# Patient Record
Sex: Female | Born: 1935 | Race: Black or African American | Hispanic: No | Smoking: Never smoker
Health system: Southern US, Community
[De-identification: ages and names within clinical notes are randomized; demographics above are authoritative.]

## PROBLEM LIST (undated history)

## (undated) DIAGNOSIS — J189 Pneumonia, unspecified organism: Secondary | ICD-10-CM

## (undated) DIAGNOSIS — I10 Essential (primary) hypertension: Secondary | ICD-10-CM

## (undated) DIAGNOSIS — E785 Hyperlipidemia, unspecified: Secondary | ICD-10-CM

## (undated) DIAGNOSIS — G3183 Dementia with Lewy bodies: Secondary | ICD-10-CM

## (undated) DIAGNOSIS — R269 Unspecified abnormalities of gait and mobility: Secondary | ICD-10-CM

## (undated) DIAGNOSIS — H409 Unspecified glaucoma: Secondary | ICD-10-CM

## (undated) DIAGNOSIS — I2510 Atherosclerotic heart disease of native coronary artery without angina pectoris: Secondary | ICD-10-CM

## (undated) DIAGNOSIS — M1990 Unspecified osteoarthritis, unspecified site: Secondary | ICD-10-CM

## (undated) DIAGNOSIS — I5030 Unspecified diastolic (congestive) heart failure: Principal | ICD-10-CM

## (undated) DIAGNOSIS — F0390 Unspecified dementia without behavioral disturbance: Secondary | ICD-10-CM

## (undated) DIAGNOSIS — I2721 Secondary pulmonary arterial hypertension: Secondary | ICD-10-CM

## (undated) DIAGNOSIS — E119 Type 2 diabetes mellitus without complications: Secondary | ICD-10-CM

## (undated) DIAGNOSIS — F0280 Dementia in other diseases classified elsewhere without behavioral disturbance: Secondary | ICD-10-CM

## (undated) HISTORY — PX: CATARACT EXTRACTION, BILATERAL: SHX1313

## (undated) HISTORY — PX: CORONARY ARTERY BYPASS GRAFT: SHX141

## (undated) HISTORY — PX: TOTAL KNEE ARTHROPLASTY: SHX125

## (undated) HISTORY — DX: Type 2 diabetes mellitus without complications: E11.9

## (undated) HISTORY — PX: ABDOMINAL HYSTERECTOMY: SHX81

## (undated) HISTORY — DX: Atherosclerotic heart disease of native coronary artery without angina pectoris: I25.10

## (undated) HISTORY — DX: Secondary pulmonary arterial hypertension: I27.21

## (undated) HISTORY — DX: Unspecified diastolic (congestive) heart failure: I50.30

## (undated) HISTORY — DX: Unspecified abnormalities of gait and mobility: R26.9

## (undated) HISTORY — DX: Pneumonia, unspecified organism: J18.9

## (undated) HISTORY — DX: Unspecified glaucoma: H40.9

## (undated) HISTORY — DX: Unspecified osteoarthritis, unspecified site: M19.90

## (undated) HISTORY — DX: Dementia with Lewy bodies: G31.83

## (undated) HISTORY — DX: Dementia in other diseases classified elsewhere without behavioral disturbance: F02.80

---

## 1998-03-30 ENCOUNTER — Emergency Department (HOSPITAL_COMMUNITY): Admission: EM | Admit: 1998-03-30 | Discharge: 1998-03-30 | Payer: Self-pay | Admitting: Emergency Medicine

## 1998-05-19 ENCOUNTER — Other Ambulatory Visit: Admission: RE | Admit: 1998-05-19 | Discharge: 1998-05-19 | Payer: Self-pay | Admitting: Internal Medicine

## 1999-06-26 ENCOUNTER — Emergency Department (HOSPITAL_COMMUNITY): Admission: EM | Admit: 1999-06-26 | Discharge: 1999-06-26 | Payer: Self-pay | Admitting: Internal Medicine

## 1999-07-07 ENCOUNTER — Emergency Department (HOSPITAL_COMMUNITY): Admission: EM | Admit: 1999-07-07 | Discharge: 1999-07-07 | Payer: Self-pay | Admitting: Emergency Medicine

## 2001-03-13 ENCOUNTER — Ambulatory Visit (HOSPITAL_COMMUNITY): Admission: RE | Admit: 2001-03-13 | Discharge: 2001-03-13 | Payer: Self-pay | Admitting: Internal Medicine

## 2001-03-13 ENCOUNTER — Encounter: Payer: Self-pay | Admitting: Internal Medicine

## 2002-12-10 ENCOUNTER — Encounter: Payer: Self-pay | Admitting: Orthopedic Surgery

## 2002-12-10 ENCOUNTER — Ambulatory Visit (HOSPITAL_COMMUNITY): Admission: RE | Admit: 2002-12-10 | Discharge: 2002-12-10 | Payer: Self-pay | Admitting: Orthopedic Surgery

## 2003-02-13 ENCOUNTER — Encounter: Payer: Self-pay | Admitting: Emergency Medicine

## 2003-02-13 ENCOUNTER — Emergency Department (HOSPITAL_COMMUNITY): Admission: EM | Admit: 2003-02-13 | Discharge: 2003-02-13 | Payer: Self-pay | Admitting: Emergency Medicine

## 2005-05-18 ENCOUNTER — Ambulatory Visit: Payer: Self-pay | Admitting: Cardiology

## 2005-05-27 ENCOUNTER — Emergency Department (HOSPITAL_COMMUNITY): Admission: EM | Admit: 2005-05-27 | Discharge: 2005-05-28 | Payer: Self-pay | Admitting: Emergency Medicine

## 2005-06-05 ENCOUNTER — Ambulatory Visit: Payer: Self-pay

## 2005-07-24 ENCOUNTER — Other Ambulatory Visit: Admission: RE | Admit: 2005-07-24 | Discharge: 2005-07-24 | Payer: Self-pay | Admitting: Obstetrics and Gynecology

## 2005-10-17 ENCOUNTER — Ambulatory Visit (HOSPITAL_COMMUNITY): Admission: RE | Admit: 2005-10-17 | Discharge: 2005-10-17 | Payer: Self-pay | Admitting: Internal Medicine

## 2006-06-04 ENCOUNTER — Ambulatory Visit: Payer: Self-pay | Admitting: Cardiology

## 2006-06-11 ENCOUNTER — Ambulatory Visit: Payer: Self-pay | Admitting: Cardiology

## 2006-06-11 ENCOUNTER — Ambulatory Visit: Payer: Self-pay

## 2006-06-18 ENCOUNTER — Encounter: Admission: RE | Admit: 2006-06-18 | Discharge: 2006-06-18 | Payer: Self-pay | Admitting: Orthopedic Surgery

## 2006-07-24 ENCOUNTER — Encounter: Admission: RE | Admit: 2006-07-24 | Discharge: 2006-07-24 | Payer: Self-pay | Admitting: Orthopedic Surgery

## 2006-09-03 ENCOUNTER — Inpatient Hospital Stay (HOSPITAL_COMMUNITY): Admission: RE | Admit: 2006-09-03 | Discharge: 2006-09-09 | Payer: Self-pay | Admitting: Orthopedic Surgery

## 2006-09-04 ENCOUNTER — Ambulatory Visit: Payer: Self-pay | Admitting: Physical Medicine & Rehabilitation

## 2006-12-18 ENCOUNTER — Ambulatory Visit: Payer: Self-pay | Admitting: Cardiology

## 2007-09-18 ENCOUNTER — Ambulatory Visit: Payer: Self-pay | Admitting: Cardiology

## 2007-09-25 ENCOUNTER — Ambulatory Visit: Payer: Self-pay

## 2007-09-29 ENCOUNTER — Ambulatory Visit: Payer: Self-pay | Admitting: Cardiology

## 2007-09-29 LAB — CONVERTED CEMR LAB
BUN: 17 mg/dL (ref 6–23)
CO2: 33 meq/L — ABNORMAL HIGH (ref 19–32)
Calcium: 10.1 mg/dL (ref 8.4–10.5)
Creatinine, Ser: 0.8 mg/dL (ref 0.4–1.2)
GFR calc Af Amer: 87 mL/min
GFR calc non Af Amer: 72 mL/min
Glucose, Bld: 95 mg/dL (ref 70–99)
Sodium: 142 meq/L (ref 135–145)

## 2008-06-17 ENCOUNTER — Ambulatory Visit: Payer: Self-pay

## 2008-10-18 ENCOUNTER — Ambulatory Visit: Payer: Self-pay | Admitting: Cardiology

## 2009-05-25 ENCOUNTER — Encounter: Payer: Self-pay | Admitting: Cardiology

## 2009-06-21 ENCOUNTER — Ambulatory Visit: Payer: Self-pay | Admitting: Cardiology

## 2009-07-08 ENCOUNTER — Ambulatory Visit: Payer: Self-pay

## 2009-07-08 ENCOUNTER — Encounter: Payer: Self-pay | Admitting: Cardiology

## 2009-07-14 ENCOUNTER — Encounter (INDEPENDENT_AMBULATORY_CARE_PROVIDER_SITE_OTHER): Payer: Self-pay | Admitting: *Deleted

## 2009-10-20 DIAGNOSIS — I10 Essential (primary) hypertension: Secondary | ICD-10-CM | POA: Insufficient documentation

## 2009-10-20 DIAGNOSIS — E785 Hyperlipidemia, unspecified: Secondary | ICD-10-CM

## 2009-10-20 DIAGNOSIS — I679 Cerebrovascular disease, unspecified: Secondary | ICD-10-CM

## 2009-10-20 DIAGNOSIS — I2510 Atherosclerotic heart disease of native coronary artery without angina pectoris: Secondary | ICD-10-CM | POA: Insufficient documentation

## 2009-10-21 ENCOUNTER — Encounter: Admission: RE | Admit: 2009-10-21 | Discharge: 2009-10-21 | Payer: Self-pay | Admitting: Orthopedic Surgery

## 2009-10-26 ENCOUNTER — Ambulatory Visit: Payer: Self-pay | Admitting: Cardiology

## 2010-06-27 ENCOUNTER — Encounter: Payer: Self-pay | Admitting: Cardiology

## 2010-07-11 ENCOUNTER — Encounter: Payer: Self-pay | Admitting: Cardiology

## 2010-07-12 ENCOUNTER — Encounter: Payer: Self-pay | Admitting: Cardiology

## 2010-07-12 ENCOUNTER — Ambulatory Visit: Payer: Self-pay

## 2010-11-01 ENCOUNTER — Encounter: Payer: Self-pay | Admitting: Cardiology

## 2010-11-01 ENCOUNTER — Ambulatory Visit: Payer: Self-pay | Admitting: Cardiology

## 2010-12-19 NOTE — Miscellaneous (Signed)
Summary: Orders Update  Clinical Lists Changes  Orders: Added new Test order of Carotid Duplex (Carotid Duplex) - Signed 

## 2010-12-21 NOTE — Assessment & Plan Note (Signed)
Summary: f1y/dm  Medications Added MAGNESIUM 500 MG TABS (MAGNESIUM) 1 tab by mouth once daily VITAMIN D 2000 UNIT TABS (CHOLECALCIFEROL) 4 tabs by mouth once daily METFORMIN HCL 500 MG TABS (METFORMIN HCL) 1 tab by mouth once daily SIMVASTATIN 80 MG TABS (SIMVASTATIN) Take one tablet by mouth daily at bedtime ALPRAZOLAM 0.5 MG TABS (ALPRAZOLAM) as needed MULTIVITAMINS   TABS (MULTIPLE VITAMIN) 1 tab by mouth once daily XALATAN 0.005 % SOLN (LATANOPROST) as directed        History of Present Illness: [name] is a pleasant  female who has a history of coronary artery disease status post coronary artery bypass and graft.  Her most recent Myoview was performed on September 25, 2007.  At that time, she was found to have normal LV function and no perfusion abnormalities.  She also has a history of cerebrovascular disease with her most recent study being performed in August 2011, and showing 40-59% bilateral stenosis. Follow up was recommended in 2 years. I last saw her in December of 2010. Since then the patient denies any dyspnea on exertion, orthopnea, PND, pedal edema, palpitations, syncope or chest pain.   Current Medications (verified): 1)  Magnesium 500 Mg Tabs (Magnesium) .Marland Kitchen.. 1 Tab By Mouth Once Daily 2)  Vitamin D 2000 Unit Tabs (Cholecalciferol) .... 4 Tabs By Mouth Once Daily 3)  Aspirin 81 Mg Tbec (Aspirin) .... Take One Tablet By Mouth Daily 4)  Ranitidine Hcl 300 Mg Caps (Ranitidine Hcl) .Marland Kitchen.. 1 Tab By Mouth Once Daily 5)  Bumetanide 1 Mg Tabs (Bumetanide) .Marland Kitchen.. 1 Tab By Mouth Once Daily 6)  Atenolol 100 Mg Tabs (Atenolol) .... Take One Tablet By Mouth Daily 7)  Lisinopril 40 Mg Tabs (Lisinopril) .... Take One Tablet By Mouth Daily 8)  Metformin Hcl 500 Mg Tabs (Metformin Hcl) .Marland Kitchen.. 1 Tab By Mouth Once Daily 9)  Simvastatin 80 Mg Tabs (Simvastatin) .... Take One Tablet By Mouth Daily At Bedtime 10)  Alprazolam 0.5 Mg Tabs (Alprazolam) .... As Needed 11)  Multivitamins   Tabs  (Multiple Vitamin) .Marland Kitchen.. 1 Tab By Mouth Once Daily 12)  Xalatan 0.005 % Soln (Latanoprost) .... As Directed  Past History:  Past Medical History: HYPERLIPIDEMIA (ICD-272.4) CEREBROVASCULAR DISEASE (ICD-437.9) HYPERTENSION (ICD-401.9) CAD (ICD-414.00)  Social History: Reviewed history from 10/26/2009 and no changes required. Tobacco Use - No.  Alcohol Use - no  Review of Systems       Patient describes decreased appetite and weight loss which is being evaluated by her primary care but no fevers or chills, productive cough, hemoptysis, dysphasia, odynophagia, melena, hematochezia, dysuria, hematuria, rash, seizure activity, orthopnea, PND, pedal edema, claudication. Remaining systems are negative.   Vital Signs:  Patient profile:   77 year old female Weight:      141 pounds Pulse rate:   61 / minute Resp:     14 per minute BP sitting:   156 / 64  (left arm)  Vitals Entered By: Kem Parkinson (November 01, 2010 10:13 AM)  Physical Exam  General:  Well-developed well-nourished in no acute distress.  Skin is warm and dry.  HEENT is normal.  Neck is supple. No thyromegaly.  Chest is clear to auscultation with normal expansion.  Cardiovascular exam is regular rate and rhythm.  Abdominal exam nontender or distended. No masses palpated. Extremities show no edema. neuro grossly intact    EKG  Procedure date:  11/01/2010  Findings:      Sinus rhythm with nonspecific ST changes.  Impression &  Recommendations:  Problem # 1:  CAD (ICD-414.00) Continue aspirin, beta blocker, ACE inhibitor and statin. Her updated medication list for this problem includes:    Aspirin 81 Mg Tbec (Aspirin) .Marland Kitchen... Take one tablet by mouth daily    Atenolol 100 Mg Tabs (Atenolol) .Marland Kitchen... Take one tablet by mouth daily    Lisinopril 40 Mg Tabs (Lisinopril) .Marland Kitchen... Take one tablet by mouth daily  Problem # 2:  HYPERTENSION (ICD-401.9) Blood pressure mildly elevated but she has not taken her  medications this a.m. She will follow this and we will adjust as needed. Potassium and renal function monitored by the primary care. Her updated medication list for this problem includes:    Aspirin 81 Mg Tbec (Aspirin) .Marland Kitchen... Take one tablet by mouth daily    Bumetanide 1 Mg Tabs (Bumetanide) .Marland Kitchen... 1 tab by mouth once daily    Atenolol 100 Mg Tabs (Atenolol) .Marland Kitchen... Take one tablet by mouth daily    Lisinopril 40 Mg Tabs (Lisinopril) .Marland Kitchen... Take one tablet by mouth daily  Problem # 3:  HYPERLIPIDEMIA (ICD-272.4) Continue present medications. Lipids and liver monitored by primary care. The following medications were removed from the medication list:    Pravastatin Sodium 40 Mg Tabs (Pravastatin sodium) .Marland Kitchen... Take one tablet by mouth daily at bedtime Her updated medication list for this problem includes:    Simvastatin 80 Mg Tabs (Simvastatin) .Marland Kitchen... Take one tablet by mouth daily at bedtime  Problem # 4:  CEREBROVASCULAR DISEASE (ICD-437.9) Continue aspirin and statin. Followup carotid Dopplers August 2013.  Patient Instructions: 1)  Your physician wants you to follow-up in: ONE YEAR  You will receive a reminder letter in the mail two months in advance. If you don't receive a letter, please call our office to schedule the follow-up appointment.   Vital Signs:  Patient profile:   77 year old female Weight:      141 pounds Pulse rate:   61 / minute Resp:     14 per minute BP sitting:   156 / 64  (left arm)  Vitals Entered By: Kem Parkinson (November 01, 2010 10:13 AM)

## 2011-04-03 NOTE — Assessment & Plan Note (Signed)
[facility]                            CARDIOLOGY OFFICE NOTE   NAME:Mccarthy, Lacey DUMIRE                        MRN:          [id number]  DATE:09/18/2007                            DOB:          [birth year]    [name] is a very pleasant female who has a history of coronary  disease, status post coronary bypassing graft.  Since I last saw her she  denies any dyspnea other than upper URI.  There is no orthopnea or PND,  there is no pedal edema.  She occasionally has what she feels is  indigestion.  Note, it is not exertional.  It is not pleuritic or  positional.  It lasts for several minutes and resolves spontaneously.   PRESENT MEDICATIONS:  1. Multivitamin daily.  2. Aspirin 81 mg p.o. daily.  3. Pravachol 40 mg p.o. daily.  4. Zantac 300 mg p.o. daily.  5. Bumex 1 mg p.o. daily.  6. Benazepril 20 mg p.o. daily.  7. Alprazolam as needed.  8. Metformin 1000 mg p.o. nightly.   PHYSICAL EXAMINATION:  Shows a blood pressure of 220/86 and his pulse is  64.  HEENT:  Normal.  NECK:  Supple.  CHEST:  Clear.  CARDIOVASCULAR:  Reveals a regular rate and rhythm.  ABDOMINAL:  Shows no tenderness.  EXTREMITIES:  Show no edema.   Patient refused an electrocardiogram today.   DIAGNOSES:  1. Coronary artery disease status post coronary bypassing graft -- The      patient is having vague chest pain that does not sound cardiac.      She has not had any symptoms in quite some time.  We will plan to      risk stratify with an adenosine Myoview.  If it shows no ischemia      then we will plan to continue with medical therapy.  She will      continue on her aspirin and statin, as well as her angiotensin      converting enzyme inhibitor.  2. History of mild cerebrovascular disease -- She will need followup      carotid Dopplers in July of 2009.  3. Hypertension -- the patient's blood pressure is elevated in the      office today.  However, she had not taken her  medications.  We did      give the patient her a.m. medications and her systolic decreased to      180.  We have increased her Lotensin to 40 mg p.o. daily and we      will plan to check a BMET in 1 week to follow potassium and renal      function.  I will see her back in approximately 2-4 weeks to make      sure that her blood pressure is stable.  4. Hyperlipidemia -- She will continue on her statin and Dr. Oneta Rack      is following her lipids and liver.  He also is following her renal      function.     Madolyn Frieze Jens Som, MD, [facility]  Electronically Signed    BSC/MedQ  DD: 09/18/2007  DT: 09/19/2007  Job #: [id number]   cc:   Lucky Cowboy, M.D.

## 2011-04-03 NOTE — Assessment & Plan Note (Signed)
[facility]                            CARDIOLOGY OFFICE NOTE   NAME:[name]                        MRN:          [id number]  DATE:09/29/2007                            DOB:          May 20, 1918    SUBJECTIVE:  [name] is a very pleasant female who I recently saw on  August 22, 2007.  She has a history of coronary artery disease, status  post coronary artery bypass and grafting.  She was complaining of  indigestion that I felt was most likely noncardiac but we did schedule a  Myoview to further evaluate.  This was performed on September 25, 2007.  There was no ischemia or infarction and her ejection fraction was 81%.  Of note also during that visit, her blood pressure was extremely  elevated and we increased her Lotensin to 40 mg p.o. daily.  Since then  she denies any dyspnea, chest pain, palpitations or syncope.   CURRENT MEDICATIONS:  Her medications at present include:  1. Benazepril 40 mg p.o. daily.  2. Aspirin 81 mg p.o. daily.  3. Multivitamin daily.  4. Pravachol 40 mg p.o. daily.  5. Zantac 300 mg p.o. daily.  6. Bumex 1 mg p.o. daily.  7. Alprazolam as needed.  8. Metformin 1 gram p.o. q.h.s.   PHYSICAL EXAMINATION:  VITAL SIGNS:  Exam today shows a blood pressure  of 129/70 and pulse of 66.  She weighs 141 pounds.  HEENT:  Normal.  NECK:  Supple with no bruits.  CHEST:  Clear.  CARDIOVASCULAR:  Exam reveals a regular rate and rhythm.  ABDOMEN:  Shows no tenderness.  EXTREMITIES:  No edema.   DIAGNOSES:  1. Recent chest pain - her Myoview showed no ischemia or infarction      and we will therefore continue with medical therapy.  2. Coronary artery disease status post coronary artery bypass and      graft - she will continue on her aspirin, Statin and ACE inhibitor.  3. History of mild cerebrovascular disease - she will need followup      carotid Doppler's in July of 2009.  4. Hypertension - her blood pressure is much  improved today.  Given      the recent increase in her Lotensin, I will check a BMET to follow      her potassium and renal functions.  5. Hyperlipidemia - she will continue on her Statin.   PLAN:  We will see her back in one year.     Madolyn Frieze Jens Som, MD, [facility]  Electronically Signed    BSC/MedQ  DD: 09/29/2007  DT: 09/29/2007  Job #: [id number]

## 2011-04-03 NOTE — Assessment & Plan Note (Signed)
[name]                            CARDIOLOGY OFFICE NOTE   NAME:Dazey, [name]                        MRN:          [id number]  DATE:10/18/2008                            DOB:          11-07-1918    [name] is a pleasant 77 year old female who has a history of coronary  artery disease status post coronary artery bypassing graft.  Her most  recent Myoview was performed on September 25, 2007.  At that time, she was  found to have normal LV function and no perfusion abnormalities.  She  also has a history of cerebrovascular disease with her most recent study  being performed on June 17, 2008, and showing 40-59% bilateral stenosis.  Follow up was recommended in 1 year.  Since I last saw her, she is doing  well from a symptomatic standpoint.  She denies any chest pain,  shortness of breath, palpitations, or syncope.  There is no pedal edema.   MEDICATIONS:  1. Benazepril 40 mg p.o. daily.  2. She also takes a multivitamin daily.  3. Aspirin 81 mg p.o. daily.  4. Pravachol 40 mg p.o. daily.  5. Zantac 300 mg p.o. daily.  6. Bumex 1 mg p.o. daily.  7. Alprazolam 0.5 mg as needed.  8. Metformin 500 mg tablets two p.o. nightly.   PHYSICAL EXAMINATION:  VITAL SIGNS:  Blood pressure of 172/79, but she  has not taken all of her medications today, her pulse is 61.  HEENT:  Normal.  NECK:  Supple.  CHEST:  Clear.  CARDIOVASCULAR:  Regular rate and rhythm.  ABDOMEN:  No tenderness.  EXTREMITIES:  No edema.   Her electrocardiogram shows a sinus rhythm at a rate of 61.  The axis is  normal.  There are no ST changes noted.   DIAGNOSES:  1. Coronary artery disease status post coronary artery bypassing graft      - [name] is doing well from symptomatic standpoint.  Her most      recent Myoview showed no ischemia.  We will continue medical      therapy including her aspirin, statin, and ACE inhibitor.  2. Hypertension - Blood pressure is elevated  today, but her      granddaughter states that it is in the normal range at home.  She      has also not taken all of her blood pressure medicines today.  She      will track this and we can increase her medications as indicated.      She will return for a BMET.  3. History of cerebrovascular disease - She will need follow up      carotid Dopplers in July 2010.  4. Hyperlipidemia - She will continue on her statin.  We will check      lipids and liver and adjust as indicated.   We will see her back in 12 months.     Madolyn Frieze Jens Som, MD, [facility]  Electronically Signed    BSC/MedQ  DD: 10/18/2008  DT: 10/19/2008  Job #: [id number]

## 2011-04-06 NOTE — Discharge Summary (Signed)
[name], [name]                 ACCOUNT NO.:  [id number]   MEDICAL RECORD NO.:  [id number]          PATIENT TYPE:  INP   LOCATION:  5040                         FACILITY:  [facility]   PHYSICIAN:  Myrtie Neither, MD      DATE OF BIRTH:  08-23-1918   DATE OF ADMISSION:  09/03/2006  DATE OF DISCHARGE:  09/09/2006                               DISCHARGE SUMMARY   ADMITTING DIAGNOSIS:  Degenerative arthritis left knee with history of  high blood pressure.   DISCHARGE DIAGNOSIS:  Degenerative arthritis left knee with history of  high blood pressure.   COMPLICATIONS:  None.   INFECTIONS:  None.   OPERATION:  Left total knee arthroplasty.   PERTINENT HISTORY:  This is an 77 year old female followed in the office  over the past few years for degenerative joint disease involving her  left knee with use of therapeutic injection, warm compresses, use of  cane and pain medication.  Patient's condition worsened with increased  immobility and persisting pain and the feeling of unsteadiness in her  knee.   PERTINENT PHYSICAL:  That of the left lower extremity, varus deformity  with lack of full extension, full flexion of the left knee.  Negative  Homans test.  Neurovascular status was intact.  Tendon crepitus, both  medial and lateral compartment and patella femoral joint with +2  effusion.   X-rays revealed sclerosis and loss of medial and lateral.   HOSPITAL COURSE:  Patient had preop medication evaluation by her medical  doctor and found to be stable to undergo surgery.  Patient had preop  laboratory, CBC, EKG, chest x-ray, PT, PTT, platelet count, UA.  Patient's laboratories were stable.  Patient underwent left total knee  arthroplasty on September 03, 2006.  Patient did quite well postoperative.  Patient had used the CPM, pre and postop IV antibiotics, Coumadin  therapy, OT, PT and case management evaluation.  Patient's pain was  brought under control with the use of Percocet.  Patient  did have some  problem with control of her blood sugar and had to be placed on sliding  scale in addition to insulin.  Patient's blood sugar did come under  control.  Pain was brought under control.   Patient progressed quite well with physical therapy with partial weight  bearing.  Patient remained stable enough to be discharged home and was  discharged with home health and PT, use of walker, partial weight  bearing on the left side and return to the office in 1 week.  The  patient was discharged in stable and satisfactory condition.     Myrtie Neither, MD  Electronically Signed    AC/MEDQ  D:  11/05/2006  T:  11/05/2006  Job:  [id number]

## 2011-04-06 NOTE — Assessment & Plan Note (Signed)
[facility]                              CARDIOLOGY OFFICE NOTE   [name], [name]                        MRN:          [id number]  DATE:06/04/2006                            DOB:          1918-09-20    [name] returns for followup today.  She is a pleasant 77 year old female  who has a history of coronary disease, status post coronary artery bypass  graft as well as hypertension.  Since I last saw her, she denies any  dyspnea, chest pain, or syncope.   MEDICATIONS:  1.  Multivitamins 1 p.o. daily.  2.  Bumetanide 2 mg tablets 1/2 p.o. q.a.m.  3.  Aspirin 81 mg p.o. daily.  4.  Diovan 80 mg p.o. daily.  5.  Atenolol 50 mg p.o. nightly.  6.  Etodolac 400 mg p.o. b.i.d.  7.  Lipitor 80 mg tablets 1/2 p.o. daily.  8.  Prevacid.   PHYSICAL EXAMINATION:  VITAL SIGNS:  Blood pressure 180/72, pulse 56.  She  weighs 160 pounds.  NECK:  Supple.  CHEST:  Clear.  CARDIOVASCULAR:  Regular rate and rhythm.  EXTREMITIES:  No edema.   Electrocardiogram shows a sinus bradycardia at a rate of 56.  There is left  ventricular hypertrophy with repolarization abnormalities.   DIAGNOSES:  1.  Coronary artery disease, status post coronary artery bypass graft.  2.  Cerebrovascular disease.  3.  Hypertension.  4.  Hyperlipidemia.   PLAN:  [name] is doing well from a symptomatic standpoint.  Her blood  pressure is elevated today.  We will increase her Diovan to 160 mg p.o.  daily.  We will check a BMET, lipids, and liver in six weeks.  We will  increase her Lipitor as needed with a goal LDL of less than 70.  She  will need follow-up carotid Dopplers for her cerebrovascular disease.  She  will otherwise continue with the risk factor modification.  I will see her  back in six months.                              Madolyn Frieze Jens Som, MD, [facility]    BSC/MedQ  DD:  06/04/2006  DT:  06/04/2006  Job #:  [id number]   cc:   Lucky Cowboy, MD

## 2011-04-06 NOTE — Assessment & Plan Note (Signed)
[facility]                            CARDIOLOGY OFFICE NOTE   ARAIYA, TILMON                        MRN:          [id number]  DATE:12/18/2006                            DOB:          06-16-1918    Mrs. Hooser is doing well since I last saw her.  There is no dyspnea,  chest pain, pedal edema or syncope.  Her most recent nuclear study was  in July 2006 that showed an ejection fraction of 65% and normal  perfusion.  Her most recent carotid Dopplers were performed on June 11, 2006.  She had a 40% to 59% right and a zero to 39% left.  Followup was  recommended in 2 years.   MEDICATIONS:  Include:  1. Multivitamin.  2. Aspirin 81 mg p.o. daily.  3. Bumex 1 mg p.o. daily.  4. Pravachol 40 mg p.o. nightly.  5. Atenolol 50 mg p.o. daily.  6. Zantac 300 mg p.o. daily.  7. Benazepril 20 mg p.o. daily.  8. Iron.  9. Alprazolam.  10.Insulin.   PHYSICAL EXAMINATION:  Shows a blood pressure of 160/78 but the patient  has not taken her medications.  Her pulse is 65.  NECK:  Is supple with no bruits.  CHEST:  Is clear.  CARDIOVASCULAR:  Shows a regular rate and rhythm.  There is a 2/6  systolic ejection murmur at the left sternal border.  S2 is preserved.  ABDOMEN:  Exam is benign.  EXTREMITIES:  Showed no edema.   Her electrocardiogram shows a sinus rhythm at a rate of 65.  There is  left ventricular hypertrophy with non-specific ST changes.   DIAGNOSES:  1. Coronary artery disease status post coronary bypassing graft.  2. Mild supravascular disease.  3. Hypertension.  4. Hyperlipidemia.   PLAN:  Mrs. Galeno is doing well from a symptomatic standpoint.  Her  blood pressure is mildly elevated today but she has not taken her  medications yet.  She will continue to tract this and we will adjust as  indicated.  Her most recent lipids were outstanding and these are being  followed  by Dr. Oneta Rack.  She will need follow up carotid Dopplers in July  2009.  She will otherwise continue with risk factor modification and I will see  her back in 12 months.     Madolyn Frieze Jens Som, MD, [facility]  Electronically Signed    BSC/MedQ  DD: 12/18/2006  DT: 12/18/2006  Job #: [id number]   cc:   Lucky Cowboy, M.D.

## 2011-04-06 NOTE — Op Note (Signed)
[name], [name]                 ACCOUNT NO.:  [id number]   MEDICAL RECORD NO.:  [id number]          PATIENT TYPE:  INP   LOCATION:  5040                         FACILITY:  [facility]   PHYSICIAN:  Myrtie Neither, MD      DATE OF BIRTH:  09-25-1918   DATE OF PROCEDURE:  09/03/2006  DATE OF DISCHARGE:                                 OPERATIVE REPORT   PREOPERATIVE DIAGNOSIS:  Degenerative arthritis left knee.   POSTOPERATIVE DIAGNOSIS:  Degenerative arthritis left knee.   ANESTHESIA:  General.   PROCEDURE:  Left total knee arthroplasty, Biomet implant.   The patient taken to operating room after given adequate preop medications,  given general anesthesia and intubated.  Left knee was prepped with DuraPrep  and draped in sterile manner.  Tourniquet and Bovie used for hemostasis.  Anterior midline incision made over the left knee going through the skin and  subcutaneous tissue from the tibial tuberosity up to the quadriceps.  Sharp  and blunt dissection was then made about the capsule.  And then a medial  paramedian capsule incision made.  Patella reflected laterally.  Knee was  taken to flexed position.  Osteophytes about the femur, tibia and patella  were resected.  Soft tissue release medially due to varus deformity was  done.  After adequate soft tissue release, the knee was able to be brought  down to a valgus position.  Next, tibial cutting jig was put in place and  tibial plateau surface resected.  Reaming down the femoral canal was then  done distal femoral cutting jig was put in place, sizing of the femur was  then done and found to be 60 mm.  Appropriate cutting block was put in place  and the anterior and posterior cuts of the femur as well as chamfer cuts  were done.  Trial component was put in place and found to fit very good and  snug.  Going back to the tibia, sizing of the tibial surface was measured at  67 mm.  Appropriate cutting jigs were put in place and appropriate  cuts.  The tibial end due to the defect medially from the varus deformity 2 more mm  of tibial plateau surface was resected.  Which allowed the knee to be  brought out into full extension, good medial and lateral stability and full  flexion.  A sizing of the patella found be medium 34 mm. Appropriate cutting  jig was put in place and cut was made.  With all three components in place  first with a 10 mm poly and then with the 12 mm poly, full range of motion  was obtained with full extension, full flexion, good medial and lateral  stability, best with the 12 mm poly.  Next, irrigation of the bony surface  was done and methacrylate was mixed.  The patella and tibial components were  cemented and the femoral component was press fitted.  After excess methyl  methacrylate was removed and setting of the cement final poly 12 mm was  locked in place.  Again range of motion, stability  was found to be very  good.  Tourniquet was let down.  Hemostasis obtained.  Then wound closure  was done with 0 Vicryl for the fascia, 2-0 for the subcutaneous and skin  staples for the skin.  Bulky compressive dressing was applied.  The patient  tolerated procedure quite well.  The patient received 20 mL of 0.25%  Marcaine with epinephrine injected into the knee.  Patient went to recovery  room in stable and satisfactory condition.     Myrtie Neither, MD  Electronically Signed    AC/MEDQ  D:  09/03/2006  T:  09/04/2006  Job:  [id number]

## 2011-05-16 ENCOUNTER — Other Ambulatory Visit: Payer: Self-pay | Admitting: Internal Medicine

## 2011-05-16 DIAGNOSIS — R634 Abnormal weight loss: Secondary | ICD-10-CM

## 2011-05-16 DIAGNOSIS — Z0000 Encounter for general adult medical examination without abnormal findings: Secondary | ICD-10-CM

## 2011-05-21 ENCOUNTER — Ambulatory Visit
Admission: RE | Admit: 2011-05-21 | Discharge: 2011-05-21 | Disposition: A | Discharge status: Home / Self Care | Payer: Medicare Other | Source: Ambulatory Visit | Attending: Internal Medicine | Admitting: Internal Medicine

## 2011-05-21 DIAGNOSIS — Z0000 Encounter for general adult medical examination without abnormal findings: Secondary | ICD-10-CM

## 2011-05-21 DIAGNOSIS — R634 Abnormal weight loss: Secondary | ICD-10-CM

## 2011-05-21 MED ORDER — IOHEXOL 300 MG/ML  SOLN
100.0000 mL | Freq: Once | INTRAMUSCULAR | Status: AC | PRN
  Administered 2011-05-21: 100 mL via INTRAVENOUS

## 2012-09-08 ENCOUNTER — Other Ambulatory Visit: Payer: Self-pay | Admitting: Internal Medicine

## 2012-09-08 DIAGNOSIS — R410 Disorientation, unspecified: Secondary | ICD-10-CM

## 2012-09-08 DIAGNOSIS — F0390 Unspecified dementia without behavioral disturbance: Secondary | ICD-10-CM

## 2012-09-10 ENCOUNTER — Ambulatory Visit
Admission: RE | Admit: 2012-09-10 | Discharge: 2012-09-10 | Disposition: A | Discharge status: Home / Self Care | Payer: Medicare Other | Source: Ambulatory Visit | Attending: Internal Medicine | Admitting: Internal Medicine

## 2012-09-10 DIAGNOSIS — F0390 Unspecified dementia without behavioral disturbance: Secondary | ICD-10-CM

## 2012-09-10 DIAGNOSIS — R410 Disorientation, unspecified: Secondary | ICD-10-CM

## 2012-09-10 MED ORDER — IOHEXOL 300 MG/ML  SOLN
75.0000 mL | Freq: Once | INTRAMUSCULAR | Status: AC | PRN
  Administered 2012-09-10: 75 mL via INTRAVENOUS

## 2012-09-15 ENCOUNTER — Encounter: Payer: Self-pay | Admitting: Cardiology

## 2012-10-02 ENCOUNTER — Encounter (HOSPITAL_COMMUNITY): Payer: Self-pay | Admitting: Emergency Medicine

## 2012-10-02 ENCOUNTER — Emergency Department (HOSPITAL_COMMUNITY)
Admission: EM | Admit: 2012-10-02 | Discharge: 2012-10-03 | Disposition: A | Discharge status: Home / Self Care | Payer: Medicare Other | Attending: Emergency Medicine | Admitting: Emergency Medicine

## 2012-10-02 ENCOUNTER — Emergency Department (HOSPITAL_COMMUNITY): Payer: Medicare Other

## 2012-10-02 DIAGNOSIS — Y92009 Unspecified place in unspecified non-institutional (private) residence as the place of occurrence of the external cause: Secondary | ICD-10-CM | POA: Insufficient documentation

## 2012-10-02 DIAGNOSIS — S0101XA Laceration without foreign body of scalp, initial encounter: Secondary | ICD-10-CM

## 2012-10-02 DIAGNOSIS — W1789XA Other fall from one level to another, initial encounter: Secondary | ICD-10-CM | POA: Insufficient documentation

## 2012-10-02 DIAGNOSIS — W19XXXA Unspecified fall, initial encounter: Secondary | ICD-10-CM

## 2012-10-02 DIAGNOSIS — S0100XA Unspecified open wound of scalp, initial encounter: Secondary | ICD-10-CM | POA: Insufficient documentation

## 2012-10-02 DIAGNOSIS — Y9301 Activity, walking, marching and hiking: Secondary | ICD-10-CM | POA: Insufficient documentation

## 2012-10-02 DIAGNOSIS — F0390 Unspecified dementia without behavioral disturbance: Secondary | ICD-10-CM | POA: Insufficient documentation

## 2012-10-02 NOTE — ED Notes (Signed)
Patient transported to CT 

## 2012-10-02 NOTE — ED Notes (Signed)
PER EMS- Patient family heard patient fall. Pt was sitting in chair on EMS arrival. LAC assesed to posterior of head. Denies back or neck pain. Upon EMS arrival pt's glucose was 61. Gave oral glucose 24 gram, CBG now 78. Alertx4, NAD.

## 2012-10-02 NOTE — ED Provider Notes (Signed)
History     CSN: [id number]  Arrival date & time 10/02/12  2233   First MD Initiated Contact with Patient 10/02/12 2234      Chief Complaint  Patient presents with  . Fall    (Consider location/radiation/quality/duration/timing/severity/associated sxs/prior treatment) HPI Comments: Approximately one hour ago the daughter states that she heard a side and went to check on the patient and her bedroom. She was found on the floor next to her bedside table. The daughter noticed a laceration on the back of her scalp and think she probably hit her head on the bedside table. She was acting normal within the daughter assessed her and was awake and alert. Her mental status is at baseline per his daughter which is mildly demented.  Patient is a 78 y.o. female presenting with fall. The history is provided by a relative. The history is limited by the condition of the patient. No language interpreter was used.  Fall The accident occurred 1 to 2 hours ago. The fall occurred while walking. She fell from a height of 1 to 2 ft. She landed on carpet. The volume of blood lost was minimal. The point of impact was the head. The pain is present in the head. The pain is mild. She was ambulatory at the scene. There was no entrapment after the fall. There was no drug use involved in the accident. There was no alcohol use involved in the accident. Pertinent negatives include no loss of consciousness. Treatment on scene includes a backboard. She has tried nothing for the symptoms. The treatment provided no relief.    No past medical history on file.  No past surgical history on file.  No family history on file.  History  Substance Use Topics  . Smoking status: Not on file  . Smokeless tobacco: Not on file  . Alcohol Use: Not on file    OB History    No data available      Review of Systems  Unable to perform ROS: Dementia  Neurological: Negative for loss of consciousness.    Allergies  Review of  patient's allergies indicates no known allergies.  Home Medications   Current Outpatient Rx  Name  Route  Sig  Dispense  Refill  . ALPRAZOLAM 0.5 MG PO TABS   Oral   Take 0.25-0.5 mg by mouth 3 (three) times daily as needed. For anxiety         . ATENOLOL 100 MG PO TABS   Oral   Take 50 mg by mouth daily.         . BUMETANIDE 1 MG PO TABS   Oral   Take 1 mg by mouth 3 (three) times a week. Monday, Wednesday, and Friday         . VITAMIN D 1000 UNITS PO TABS   Oral   Take 8,000 Units by mouth daily.         Marland Kitchen LISINOPRIL 40 MG PO TABS   Oral   Take 40 mg by mouth daily.         Marland Kitchen METFORMIN HCL 500 MG PO TABS   Oral   Take 1,000 mg by mouth at bedtime.         Marland Kitchen QUETIAPINE FUMARATE 25 MG PO TABS   Oral   Take 25 mg by mouth 3 (three) times daily.         Marland Kitchen SIMVASTATIN 80 MG PO TABS   Oral   Take 80 mg by mouth at  bedtime.           There were no vitals taken for this visit.  Physical Exam  Nursing note and vitals reviewed. Constitutional: She appears well-developed and well-nourished. No distress.  HENT:  Head: Normocephalic.  Mouth/Throat: No oropharyngeal exudate.       2 cm laceration that is hemostatic over the upper occipital scalp.  Eyes: EOM are normal. Pupils are equal, round, and reactive to light. Right eye exhibits no discharge. Left eye exhibits no discharge.  Neck: Neck supple. No JVD present.  Cardiovascular: Normal rate, regular rhythm and normal heart sounds.   Pulmonary/Chest: Effort normal and breath sounds normal. No stridor. No respiratory distress. She exhibits no tenderness.  Abdominal: Soft. Bowel sounds are normal. She exhibits no distension. There is no tenderness. There is no guarding.  Musculoskeletal: Normal range of motion. She exhibits no edema and no tenderness.  Neurological: She is alert. She displays normal reflexes. No cranial nerve deficit. She exhibits normal muscle tone.       nml symmetric tone all 4  extremiites. 2+ patel refl. No sens defs.  Skin: Skin is warm and dry. No rash noted. She is not diaphoretic.  Psychiatric: She has a normal mood and affect. Her behavior is normal. Judgment and thought content normal.    ED Course  LACERATION REPAIR Performed by: Warrick Parisian Authorized by: Warrick Parisian Consent: Verbal consent obtained. Body area: head/neck Location details: scalp Laceration length: 3 cm Foreign bodies: no foreign bodies Tendon involvement: none Nerve involvement: none Vascular damage: no Anesthesia: local infiltration Local anesthetic: lidocaine 2% without epinephrine Anesthetic total: 5 ml Patient sedated: no Debridement: none Degree of undermining: none Skin closure: staples Number of sutures: 3 Technique: simple Approximation: close Patient tolerance: Patient tolerated the procedure well with no immediate complications.   (including critical care time)  Labs Reviewed - No data to display Ct Head Wo Contrast  10/03/2012  *RADIOLOGY REPORT*  Clinical Data:  Fall, laceration to back of head.  CT HEAD WITHOUT CONTRAST CT CERVICAL SPINE WITHOUT CONTRAST  Technique:  Multidetector CT imaging of the head and cervical spine was performed following the standard protocol without intravenous contrast.  Multiplanar CT image reconstructions of the cervical spine were also generated.  Comparison:  09/10/2012  CT HEAD  Findings: Age related volume loss. No acute intracranial abnormality.  Specifically, no hemorrhage, hydrocephalus, mass lesion, acute infarction, or significant intracranial injury.  No acute calvarial abnormality. Visualized paranasal sinuses and mastoids clear.  Orbital soft tissues unremarkable.  IMPRESSION: Normal for patient's age.  CT CERVICAL SPINE  Findings: Degenerative disc and facet disease diffusely throughout the cervical spine.  Prevertebral soft tissues are normal.  Normal alignment.  No fracture.  No epidural or paraspinal hematoma.   IMPRESSION: Diffuse degenerative changes.  No acute bony abnormality.   Original Report Authenticated By: Charlett Nose, M.D.    Ct Cervical Spine Wo Contrast  10/03/2012  *RADIOLOGY REPORT*  Clinical Data:  Fall, laceration to back of head.  CT HEAD WITHOUT CONTRAST CT CERVICAL SPINE WITHOUT CONTRAST  Technique:  Multidetector CT imaging of the head and cervical spine was performed following the standard protocol without intravenous contrast.  Multiplanar CT image reconstructions of the cervical spine were also generated.  Comparison:  09/10/2012  CT HEAD  Findings: Age related volume loss. No acute intracranial abnormality.  Specifically, no hemorrhage, hydrocephalus, mass lesion, acute infarction, or significant intracranial injury.  No acute calvarial abnormality. Visualized paranasal sinuses and mastoids clear.  Orbital soft tissues unremarkable.  IMPRESSION: Normal for patient's age.  CT CERVICAL SPINE  Findings: Degenerative disc and facet disease diffusely throughout the cervical spine.  Prevertebral soft tissues are normal.  Normal alignment.  No fracture.  No epidural or paraspinal hematoma.  IMPRESSION: Diffuse degenerative changes.  No acute bony abnormality.   Original Report Authenticated By: Charlett Nose, M.D.      1. Fall   2. Scalp laceration      Date: 10/02/2012  Rate: 60  Rhythm: normal sinus rhythm  QRS Axis: normal  Intervals: normal  ST/T Wave abnormalities: normal  Conduction Disutrbances: none  Narrative Interpretation: nml  Old EKG Reviewed: No significant changes noted     MDM  10:46 PM the patient fell on her head approximately one hour ago. She does have dementia so history is limited but it sounds like a mechanical fall. She denies any loss of consciousness and only complains of mild headache over the back of her head where her laceration is. She is neurologically intact on exam. She denies neck pain and has no tenderness or step-offs on exam but due to her age and  her dementia I will get CT of C-spine to evaluate for a C-spine fracture, will also get CT head to evaluate for traumatic head bleed. I will repair the scalp laceration after CT head is done. It is hemostatic. She was also noted to be mildly hypoglycemic in the 60s by EMS and was given oral glucose. Recheck blood sugar was in the 70s. This may have contributed to her fall.  12:22 AM CT neg. appaers well.  Pt deemed stable for discharge. Return precautions were provided and pt expressed understanding to return to ED if any acute symptoms return. Follow up was instructed which pt also expressed understanding. All questions were answered and pt was in agreement w/ plan.       Warrick Parisian, MD 10/03/12 [phone]

## 2012-10-02 NOTE — ED Notes (Signed)
Pt Dx was early dementia 6 months ago. Pt was hypertensive in route.

## 2012-10-06 NOTE — ED Provider Notes (Signed)
I saw and evaluated the patient, reviewed the resident's note and I agree with the findings and plan. I reviewed and interpreted the EKG during the patient's evaluation in the ED and agree with the resident's interpretation.  At this time there does not appear to be any evidence of an acute emergency medical condition and the patient appears stable for discharge with appropriate outpatient follow up.   Celene Kras, MD 10/06/12 [phone]

## 2012-10-30 DIAGNOSIS — R269 Unspecified abnormalities of gait and mobility: Secondary | ICD-10-CM | POA: Insufficient documentation

## 2012-10-30 DIAGNOSIS — R6889 Other general symptoms and signs: Secondary | ICD-10-CM | POA: Insufficient documentation

## 2012-10-30 DIAGNOSIS — D518 Other vitamin B12 deficiency anemias: Secondary | ICD-10-CM | POA: Insufficient documentation

## 2012-10-30 DIAGNOSIS — F079 Unspecified personality and behavioral disorder due to known physiological condition: Secondary | ICD-10-CM | POA: Insufficient documentation

## 2013-02-18 ENCOUNTER — Ambulatory Visit: Payer: Self-pay | Admitting: Neurology

## 2013-02-26 ENCOUNTER — Encounter (HOSPITAL_COMMUNITY): Payer: Self-pay | Admitting: *Deleted

## 2013-02-26 ENCOUNTER — Emergency Department (HOSPITAL_COMMUNITY)
Admission: EM | Admit: 2013-02-26 | Discharge: 2013-02-27 | Disposition: A | Discharge status: Skilled Nursing Facility | Payer: Medicare Other | Attending: Emergency Medicine | Admitting: Emergency Medicine

## 2013-02-26 DIAGNOSIS — Y939 Activity, unspecified: Secondary | ICD-10-CM | POA: Insufficient documentation

## 2013-02-26 DIAGNOSIS — T424X5A Adverse effect of benzodiazepines, initial encounter: Secondary | ICD-10-CM | POA: Insufficient documentation

## 2013-02-26 DIAGNOSIS — I10 Essential (primary) hypertension: Secondary | ICD-10-CM | POA: Insufficient documentation

## 2013-02-26 DIAGNOSIS — E785 Hyperlipidemia, unspecified: Secondary | ICD-10-CM | POA: Insufficient documentation

## 2013-02-26 DIAGNOSIS — F0390 Unspecified dementia without behavioral disturbance: Secondary | ICD-10-CM | POA: Insufficient documentation

## 2013-02-26 DIAGNOSIS — T50905A Adverse effect of unspecified drugs, medicaments and biological substances, initial encounter: Secondary | ICD-10-CM

## 2013-02-26 DIAGNOSIS — W06XXXA Fall from bed, initial encounter: Secondary | ICD-10-CM | POA: Insufficient documentation

## 2013-02-26 DIAGNOSIS — Z79899 Other long term (current) drug therapy: Secondary | ICD-10-CM | POA: Insufficient documentation

## 2013-02-26 DIAGNOSIS — Y929 Unspecified place or not applicable: Secondary | ICD-10-CM | POA: Insufficient documentation

## 2013-02-26 DIAGNOSIS — S0003XA Contusion of scalp, initial encounter: Secondary | ICD-10-CM | POA: Insufficient documentation

## 2013-02-26 DIAGNOSIS — S1093XA Contusion of unspecified part of neck, initial encounter: Secondary | ICD-10-CM | POA: Insufficient documentation

## 2013-02-26 HISTORY — DX: Unspecified dementia, unspecified severity, without behavioral disturbance, psychotic disturbance, mood disturbance, and anxiety: F03.90

## 2013-02-26 HISTORY — DX: Hyperlipidemia, unspecified: E78.5

## 2013-02-26 HISTORY — DX: Essential (primary) hypertension: I10

## 2013-02-26 NOTE — ED Notes (Addendum)
Pt sleepy, responds to speech and touch, states the side of her neck hurts. Small hematoma noted to R side of forehead.

## 2013-02-26 NOTE — ED Notes (Signed)
Per EMS pt from [facility], pt fell out of bed about 30 minutes ago, 1 foot fall, laying on floor w/ hematoma to R side of forehead, pt is sleepier than normal but was given 2 ativan before fall, BP 123, HR 64, RR 18, 96% RA.

## 2013-02-27 ENCOUNTER — Emergency Department (HOSPITAL_COMMUNITY): Payer: Medicare Other

## 2013-02-27 ENCOUNTER — Telehealth: Payer: Self-pay | Admitting: *Deleted

## 2013-02-27 LAB — POCT I-STAT, CHEM 8
BUN: 19 mg/dL (ref 6–23)
Calcium, Ion: 1.35 mmol/L — ABNORMAL HIGH (ref 1.13–1.30)
Chloride: 101 mEq/L (ref 96–112)
Glucose, Bld: 84 mg/dL (ref 70–99)
Potassium: 3.9 mEq/L (ref 3.5–5.1)

## 2013-02-27 LAB — URINALYSIS, ROUTINE W REFLEX MICROSCOPIC
Bilirubin Urine: NEGATIVE
Glucose, UA: NEGATIVE mg/dL
Hgb urine dipstick: NEGATIVE
Ketones, ur: NEGATIVE mg/dL
Protein, ur: NEGATIVE mg/dL
pH: 6.5 (ref 5.0–8.0)

## 2013-02-27 MED ORDER — SODIUM CHLORIDE 0.9 % IV BOLUS (SEPSIS)
250.0000 mL | Freq: Once | INTRAVENOUS | Status: AC
  Administered 2013-02-27: 250 mL via INTRAVENOUS

## 2013-02-27 NOTE — ED Notes (Addendum)
CG4 I-Stat given to Dr. Rulon Abide.

## 2013-02-27 NOTE — Telephone Encounter (Signed)
I called Ms. Verdis Prime concerning this patient reguarding the falls. Ms. Verdis Prime has already left for the weekend. I will call next Monday.

## 2013-02-27 NOTE — Telephone Encounter (Signed)
Ms. Jangle called from the facility where [name] is living wanting to speak with physician concerning several falls [name] has had. The last fall sent the patient to the ER. Ms. Jangle would like to discuss patient's medication list. 

## 2013-02-27 NOTE — ED Notes (Signed)
[facility] called to transport pt back to [facility]

## 2013-02-27 NOTE — ED Notes (Signed)
Patient transported to CT 

## 2013-02-27 NOTE — ED Notes (Signed)
EKG old and new given to EDP, Bonk,MD. 

## 2013-02-27 NOTE — ED Provider Notes (Signed)
History     CSN: [id number]  Arrival date & time 02/26/13  2336   First MD Initiated Contact with Patient 02/26/13 2346      Chief Complaint  Patient presents with  . Fall   Level V caveat for altered mental status/dementia  HPI [name] is a 79 y.o. female arrive from [facility] with a fall out of bed about 30 minutes ago. Evidently the patient fell about 1 foot from the bed to the floor, she was laying on the floor and did not complain about any pain to EMS and so was not placed in cervical collar. Patient has a small contusion to the right upper forehead. Patient is noted to be "sleepier" than normal but was given 2 Ativan prior to her fall. Patient is currently not a good historian, she is awake, she is not oriented.   Past Medical History  Diagnosis Date  . Hypertension   . Dementia   . Hyperlipidemia     History reviewed. No pertinent past surgical history.  No family history on file.  History  Substance Use Topics  . Smoking status: Never Smoker   . Smokeless tobacco: Never Used  . Alcohol Use: No    OB History   Grav Para Term Preterm Abortions TAB SAB Ect Mult Living                  Review of Systems Level V caveat for altered mental status/dementia Allergies  Review of patient's allergies indicates no known allergies.  Home Medications   Current Outpatient Rx  Name  Route  Sig  Dispense  Refill  . ALPRAZolam (XANAX) 0.5 MG tablet   Oral   Take 0.25-0.5 mg by mouth 3 (three) times daily as needed. For anxiety         . atenolol (TENORMIN) 100 MG tablet   Oral   Take 50 mg by mouth daily.         . bumetanide (BUMEX) 1 MG tablet   Oral   Take 1 mg by mouth 3 (three) times a week. Monday, Wednesday, and Friday         . cholecalciferol (VITAMIN D) 1000 UNITS tablet   Oral   Take 8,000 Units by mouth daily.         Marland Kitchen lisinopril (PRINIVIL,ZESTRIL) 40 MG tablet   Oral   Take 40 mg by mouth daily.         . metFORMIN  (GLUCOPHAGE) 500 MG tablet   Oral   Take 1,000 mg by mouth at bedtime.         Marland Kitchen QUEtiapine (SEROQUEL) 25 MG tablet   Oral   Take 25 mg by mouth 3 (three) times daily.         . simvastatin (ZOCOR) 80 MG tablet   Oral   Take 80 mg by mouth at bedtime.           BP 162/54  Pulse 75  Resp 15  SpO2 93%  Physical Exam  Nursing notes reviewed.  Electronic medical record reviewed. VITAL SIGNS:   Filed Vitals:   02/26/13 2343  BP: 162/54  Pulse: 75  Resp: 15  SpO2: 93%   CONSTITUTIONAL: Awake, not oriented, appears sleepy HENT: Contusion to right side of the forehead, normocephalic, oral mucosa pink and moist, airway patent. Nares patent without drainage. External ears normal. EYES: Conjunctiva clear, EOMI, PERRLA NECK: Trachea midline, non-tender, supple CARDIOVASCULAR: Normal heart rate, Normal rhythm, No  murmurs, rubs, gallops PULMONARY/CHEST: Clear to auscultation, no rhonchi, wheezes, or rales. Symmetrical breath sounds. Non-tender. ABDOMINAL: Non-distended, soft, non-tender - no rebound or guarding.  BS normal. NEUROLOGIC: Non-focal, moving all four extremities, no gross sensory or motor deficits. EXTREMITIES: No clubbing, cyanosis, or edema SKIN: Warm, Dry, No erythema, No rash  ED Course  Procedures (including critical care time)  Date: 02/27/2013  Rate: 64  Rhythm: normal sinus rhythm  QRS Axis: normal  Intervals: normal  ST/T Wave abnormalities: normal  Conduction Disutrbances: none  Narrative Interpretation: No significant change from prior EKG dated 08/29/2012   Labs Reviewed  POCT I-STAT, CHEM 8 - Abnormal; Notable for the following:    Calcium, Ion 1.35 (*)    All other components within normal limits  URINALYSIS, ROUTINE W REFLEX MICROSCOPIC  CG4 I-STAT (LACTIC ACID)  POCT I-STAT TROPONIN I   Ct Head Wo Contrast  02/27/2013  *RADIOLOGY REPORT*  Clinical Data:  Fall from bed.  The hematoma.  Right-sided neck pain.  CT HEAD WITHOUT CONTRAST CT  CERVICAL SPINE WITHOUT CONTRAST  Technique:  Multidetector CT imaging of the head and cervical spine was performed following the standard protocol without intravenous contrast.  Multiplanar CT image reconstructions of the cervical spine were also generated.  Comparison:  CT head and cervical spine 10/02/2012.  CT HEAD  Findings: Mild atrophy and white matter disease is stable, likely within normal limits for age.  No acute infarct, hemorrhage, or mass lesion is present.  The ventricles are of normal size.  There is no significant extra-axial fluid collection.  Right supraorbital scalp soft tissue swelling is present.  There is no underlying fracture.  There is patient motion through the skull base. These images were repeated.  A fluid level is present in the right maxillary sinus.  Mucosal thickening is present in the maxillary sinuses bilaterally.  No acute osseous abnormality is present.  IMPRESSION:  1.  Normal CT appearance of the brain for age. 2.  Right supraorbital scalp soft tissue swelling without an underlying fracture. 3.  Right greater than left maxillary sinus disease.  CT CERVICAL SPINE  Findings: Cervical spine is imaged from skull base through T2-3. Mild endplate degenerative changes are stable.  No significant disease is on the left at C3-4.  No acute fracture or traumatic subluxation is evident.  The soft tissues demonstrate carotid bifurcation calcifications bilaterally.  A nodular appearance of the thyroid is stable.  The lung apices are clear.  IMPRESSION:  1.  No acute abnormality. 2.  Stable degenerative changes of the cervical spine, most pronounced at C3-4.   Original Report Authenticated By: Marin Roberts, M.D.    Ct Cervical Spine Wo Contrast  02/27/2013  *RADIOLOGY REPORT*  Clinical Data:  Fall from bed.  The hematoma.  Right-sided neck pain.  CT HEAD WITHOUT CONTRAST CT CERVICAL SPINE WITHOUT CONTRAST  Technique:  Multidetector CT imaging of the head and cervical spine was  performed following the standard protocol without intravenous contrast.  Multiplanar CT image reconstructions of the cervical spine were also generated.  Comparison:  CT head and cervical spine 10/02/2012.  CT HEAD  Findings: Mild atrophy and white matter disease is stable, likely within normal limits for age.  No acute infarct, hemorrhage, or mass lesion is present.  The ventricles are of normal size.  There is no significant extra-axial fluid collection.  Right supraorbital scalp soft tissue swelling is present.  There is no underlying fracture.  There is patient motion through the skull  base. These images were repeated.  A fluid level is present in the right maxillary sinus.  Mucosal thickening is present in the maxillary sinuses bilaterally.  No acute osseous abnormality is present.  IMPRESSION:  1.  Normal CT appearance of the brain for age. 2.  Right supraorbital scalp soft tissue swelling without an underlying fracture. 3.  Right greater than left maxillary sinus disease.  CT CERVICAL SPINE  Findings: Cervical spine is imaged from skull base through T2-3. Mild endplate degenerative changes are stable.  No significant disease is on the left at C3-4.  No acute fracture or traumatic subluxation is evident.  The soft tissues demonstrate carotid bifurcation calcifications bilaterally.  A nodular appearance of the thyroid is stable.  The lung apices are clear.  IMPRESSION:  1.  No acute abnormality. 2.  Stable degenerative changes of the cervical spine, most pronounced at C3-4.   Original Report Authenticated By: Marin Roberts, M.D.    Dg Chest Port 1 View  02/27/2013  *RADIOLOGY REPORT*  Clinical Data: Altered mental status.  Fall.  PORTABLE CHEST - 1 VIEW  Comparison: Two-view chest 05/21/2011.  Findings: The heart is mildly enlarged.  There is no edema or effusion to suggest failure.  Degenerative changes are again noted at the shoulders bilaterally.  The patient is status post median sternotomy.   IMPRESSION:  1.  Stable cardiomegaly without failure. 2.  No acute cardiopulmonary disease.   Original Report Authenticated By: Marin Roberts, M.D.      1. Fall from bed, initial encounter   2. Medication side effect, initial encounter       MDM  Heaven Meeker Atayde is a 79 y.o. femalepresenting with small hematoma to right forehead.  Pt has baseline dementia per records and EMS report - also pt fell about 1 ft.  Ensure confusion is secondary, will CT head/neck, UA EKG, and basic labs.  Labs/Radiology studies are not acute, no UTI, no PNA.  Pt appears to be at baseline, s/p 2mg  Ativan.  Will DC to SNF - VSS/WNL, don't think 96 temp gotten orally represents sepsis with the rest of the clinical picture.          Jones Skene, MD 02/27/13 [phone]

## 2013-02-27 NOTE — Telephone Encounter (Signed)
[name] called from the facility where [name] is living wanting to speak with physician concerning several falls [name] has had. The last fall sent the patient to the ER. [name] would like to discuss patient's medication list.

## 2013-03-02 NOTE — Telephone Encounter (Signed)
I called the extended care facility. The patient has had several falls, and the Seroquel dose was recently increased to 25 mg at night. They want me to review the medication list and make any recommendations.

## 2013-03-09 NOTE — Telephone Encounter (Signed)
Calling again for the med review or appt.  Did not make 02/18/13 appt. When scheduled.

## 2013-03-10 ENCOUNTER — Telehealth: Payer: Self-pay | Admitting: *Deleted

## 2013-03-10 NOTE — Telephone Encounter (Signed)
Message copied by Monico Blitz on Tue Mar 10, 2013  5:24 PM ------      Message from: Warren Lacy A      Created: Tue Mar 10, 2013 12:56 PM      Contact: Cristi Loron, from [facility] called and said she has called several times and wanted to know if Dr. Anne Hahn has had a chance to review Andrika's list of medication and if he wants to see her in the office.  Please call her at [phone].            Thanks ------

## 2013-03-13 NOTE — Telephone Encounter (Signed)
[facility] (danielle) is requesting the medicine review because patient has had multiple falls with the past two weeks, unable to take steps without being held up.

## 2013-03-18 ENCOUNTER — Telehealth: Payer: Self-pay | Admitting: Neurology

## 2013-03-18 NOTE — Telephone Encounter (Signed)
I spoke to [place] from [facility] and told her that the patient had missed an appointment on 02-18-13.  She was not aware of that appointment.  I have forwarded info to schedulers to get the patient an appointment.

## 2013-03-18 NOTE — Telephone Encounter (Signed)
Message copied by Elisha Headland on Wed Mar 18, 2013  9:38 AM ------      Message from: Warren Lacy A      Created: Mon Mar 16, 2013  4:43 PM      Contact: Suzanna Obey, from [address], called regarding [place] Plasencia, saying that she keeps falling and that she needs to be seen soon as possible. Please call Tonya at [phone]            Thanks ------

## 2013-03-25 ENCOUNTER — Telehealth: Payer: Self-pay | Admitting: Neurology

## 2013-03-25 NOTE — Telephone Encounter (Signed)
I called and spoke with [facility] to inform them of [name] appt. on Friday May 9@3 :45

## 2013-03-25 NOTE — Telephone Encounter (Signed)
Danielle from [facility] came to office today to see if doctor could review patient's medication list.  I have also had calls from [facility] about getting an appointment because the patient keeps falling.  They refaxed the medicine list and said it was OK if doctor wanted to review it at her next appointment.  Jasmine December will work on getting an appointment for patient.  I told Duwayne Heck that patient did have an appointment on 02-18-13 but was a no show.  The facility did not know about that appt.

## 2013-03-25 NOTE — Telephone Encounter (Signed)
Patient has been scheduled for 03-27-13@4 :00.

## 2013-03-26 ENCOUNTER — Encounter: Payer: Self-pay | Admitting: Neurology

## 2013-03-26 DIAGNOSIS — D518 Other vitamin B12 deficiency anemias: Secondary | ICD-10-CM

## 2013-03-26 DIAGNOSIS — F079 Unspecified personality and behavioral disorder due to known physiological condition: Secondary | ICD-10-CM

## 2013-03-26 DIAGNOSIS — R269 Unspecified abnormalities of gait and mobility: Secondary | ICD-10-CM

## 2013-03-26 DIAGNOSIS — R6889 Other general symptoms and signs: Secondary | ICD-10-CM

## 2013-03-27 ENCOUNTER — Ambulatory Visit (INDEPENDENT_AMBULATORY_CARE_PROVIDER_SITE_OTHER): Payer: Medicare Other | Admitting: Neurology

## 2013-03-27 ENCOUNTER — Encounter: Payer: Self-pay | Admitting: Neurology

## 2013-03-27 VITALS — BP 109/53 | HR 51

## 2013-03-27 DIAGNOSIS — F0280 Dementia in other diseases classified elsewhere without behavioral disturbance: Secondary | ICD-10-CM

## 2013-03-27 DIAGNOSIS — G3183 Dementia with Lewy bodies: Secondary | ICD-10-CM

## 2013-03-27 DIAGNOSIS — R269 Unspecified abnormalities of gait and mobility: Secondary | ICD-10-CM

## 2013-03-27 HISTORY — DX: Dementia in other diseases classified elsewhere, unspecified severity, without behavioral disturbance, psychotic disturbance, mood disturbance, and anxiety: F02.80

## 2013-03-27 NOTE — Progress Notes (Signed)
Reason for visit: Memory disturbance  [name] is an 79 y.o. female  History of present illness:  [name] is a 79 year old right-handed black female with a history of a memory disturbance dating back about one and one half years. The patient has had a relatively rapid progression of her memory problems, and she currently is in an extended care facility. The patient has had a significant gait disorder, and the possibility of a Lewy body dementia process was considered. The patient has had significant problems with hallucinations and some agitation. The patient is on low-dose Seroquel, and alprazolam. The patient has had multiple falls, and she is sent to this office for further evaluation. The patient recently had a CT scan of the brain that was termed as being normal for age. A CT of the cervical spine was done and this does not show evidence of spinal cord compression. The patient returns for an evaluation.  Past Medical History  Diagnosis Date  . Hypertension   . Dementia     Possible Lewy body dementia  . Hyperlipidemia   . Diabetes   . CAD (coronary artery disease)   . Degenerative arthritis   . Gait disorder   . Glaucoma   . Dementia with Lewy bodies 03/27/2013    Past Surgical History  Procedure Laterality Date  . Abdominal hysterectomy      for uterine fibroids  . Coronary artery bypass graft    . Total knee arthroplasty Left   . Cataract extraction, bilateral      Family History  Problem Relation Age of Onset  . Stroke Father   . Cancer Brother   . Heart failure Brother     Social history:  reports that she has never smoked. She has never used smokeless tobacco. She reports that she does not drink alcohol or use illicit drugs.  Allergies:  Allergies  Allergen Reactions  . Seroquel (Quetiapine Fumarate)     Medications:  Current Outpatient Prescriptions on File Prior to Visit  Medication Sig Dispense Refill  . ALPRAZolam (XANAX) 0.5 MG tablet Take 1 mg  by mouth at bedtime. scheduled      . ALPRAZolam (XANAX) 0.5 MG tablet Take 0.5 mg by mouth 2 (two) times daily. Scheduled. Take 1 tablet at breakfast & at 3pm.      . atenolol (TENORMIN) 100 MG tablet Take 50 mg by mouth daily.      . brimonidine-timolol (COMBIGAN) 0.2-0.5 % ophthalmic solution Place 1 drop into the right eye every 12 (twelve) hours.      . bumetanide (BUMEX) 1 MG tablet Take 1 mg by mouth 3 (three) times a week. Monday, Wednesday, and Friday      . cholecalciferol (VITAMIN D) 1000 UNITS tablet Take 8,000 Units by mouth daily.      . citalopram (CELEXA) 20 MG tablet Take 20 mg by mouth 2 (two) times daily.      Marland Kitchen latanoprost (XALATAN) 0.005 % ophthalmic solution Place 1 drop into the right eye at bedtime.      Marland Kitchen lisinopril (PRINIVIL,ZESTRIL) 40 MG tablet Take 40 mg by mouth daily.      . Multiple Vitamins-Minerals (CERTAVITE/ANTIOXIDANTS PO) Take 1 tablet by mouth daily.      . Multiple Vitamins-Minerals (OCUVITE PRESERVISION PO) Take 1 tablet by mouth daily.      . QUEtiapine (SEROQUEL) 25 MG tablet Take 12.5 mg by mouth at bedtime.       . simvastatin (ZOCOR) 40 MG tablet  Take 40 mg by mouth daily.      . traZODone (DESYREL) 100 MG tablet Take 200 mg by mouth at bedtime.       No current facility-administered medications on file prior to visit.    ROS:  Out of a complete 14 system review of symptoms, the patient complains only of the following symptoms, and all other reviewed systems are negative.  Memory disturbance Gait disturbance  Blood pressure 109/53, pulse 51.  Physical Exam  General: The patient is sleepy, difficult to arouse at the time of the examination.  Skin: No significant peripheral edema is noted.   Neurologic Exam  Mental status: Mini-Mental status examination was attempted, but the patient was too drowsy to participate.  Cranial nerves: Facial symmetry is present. Speech is normal, no aphasia or dysarthria is noted. Extraocular movements are  full. Visual fields are full.  Motor: The patient has good strength in all 4 extremities.  Coordination: The patient has severe apraxia with the use of the extremities, and difficulty performing finger nose finger and heel to shin bilaterally.  Gait and station: The patient requires assistance with standing. The patient leans backwards when attempting to stand, and she cannot stand up on her own. The patient could not functionally ambulate.  Reflexes: Deep tendon reflexes are symmetric.   Assessment/Plan:  One. Progressive memory disturbance, possible Lewy body dementia  2. Gait disturbance  The patient has had recent CT evaluation of the brain and cervical spine. The patient has a severe gait disturbance at this point, and her walking has significantly changed from her last visit in December of 2013. The patient is functionally nonambulatory, with a tendency to lean backwards. The patient is quite drowsy today, and she cannot be aroused for long durations of time. The patient may be oversedated with medications. I would recommend discontinuance of the Seroquel at night, and reduce the alprazolam during the day to 0.25 mg twice daily, 1 mg of the Xanax at night. The patient will followup in 4 months. Currently, this gait disorder is not treatable. The patient will require a bed alarm and a seat alarm at all times.  Marlan Palau MD 03/28/2013 9:46 AM  [facility] [address] [place], [place] [address]  Phone [phone] Fax [phone]

## 2013-03-27 NOTE — Patient Instructions (Signed)
The patient is drowsy in the exam today. Would cut back on the alprazolam to 0.25 mg po bid and 1 mg at night. Discontinue the Seroquel. The gait disturbance is severe, and likely not treatable.

## 2013-04-02 ENCOUNTER — Other Ambulatory Visit: Payer: Self-pay | Admitting: *Deleted

## 2013-04-02 ENCOUNTER — Telehealth: Payer: Self-pay | Admitting: *Deleted

## 2013-04-02 ENCOUNTER — Other Ambulatory Visit: Payer: Self-pay

## 2013-04-02 DIAGNOSIS — F0280 Dementia in other diseases classified elsewhere without behavioral disturbance: Secondary | ICD-10-CM

## 2013-04-02 DIAGNOSIS — G3183 Dementia with Lewy bodies: Secondary | ICD-10-CM

## 2013-04-02 MED ORDER — ALPRAZOLAM 0.5 MG PO TABS: 0.5000 mg | ORAL_TABLET | ORAL | Status: DC

## 2013-04-02 NOTE — Telephone Encounter (Signed)
Nurse asked me to reprint Rx.

## 2013-04-02 NOTE — Telephone Encounter (Signed)
Danielle calling for hard copy of instructions for xanax order.

## 2013-05-13 ENCOUNTER — Observation Stay (HOSPITAL_COMMUNITY)
Admission: EM | Admit: 2013-05-13 | Discharge: 2013-05-14 | Disposition: A | Discharge status: Skilled Nursing Facility | Payer: Medicare Other | Attending: Internal Medicine | Admitting: Internal Medicine

## 2013-05-13 ENCOUNTER — Encounter (HOSPITAL_COMMUNITY): Payer: Self-pay | Admitting: Emergency Medicine

## 2013-05-13 ENCOUNTER — Emergency Department (HOSPITAL_COMMUNITY): Payer: Medicare Other

## 2013-05-13 DIAGNOSIS — I10 Essential (primary) hypertension: Secondary | ICD-10-CM | POA: Insufficient documentation

## 2013-05-13 DIAGNOSIS — E785 Hyperlipidemia, unspecified: Secondary | ICD-10-CM | POA: Diagnosis not present

## 2013-05-13 DIAGNOSIS — G3183 Dementia with Lewy bodies: Secondary | ICD-10-CM | POA: Insufficient documentation

## 2013-05-13 DIAGNOSIS — R6889 Other general symptoms and signs: Secondary | ICD-10-CM

## 2013-05-13 DIAGNOSIS — F079 Unspecified personality and behavioral disorder due to known physiological condition: Secondary | ICD-10-CM

## 2013-05-13 DIAGNOSIS — R269 Unspecified abnormalities of gait and mobility: Secondary | ICD-10-CM

## 2013-05-13 DIAGNOSIS — E119 Type 2 diabetes mellitus without complications: Secondary | ICD-10-CM | POA: Diagnosis not present

## 2013-05-13 DIAGNOSIS — R4182 Altered mental status, unspecified: Secondary | ICD-10-CM | POA: Insufficient documentation

## 2013-05-13 DIAGNOSIS — F0280 Dementia in other diseases classified elsewhere without behavioral disturbance: Secondary | ICD-10-CM | POA: Diagnosis present

## 2013-05-13 DIAGNOSIS — I2510 Atherosclerotic heart disease of native coronary artery without angina pectoris: Secondary | ICD-10-CM | POA: Insufficient documentation

## 2013-05-13 DIAGNOSIS — I679 Cerebrovascular disease, unspecified: Secondary | ICD-10-CM | POA: Diagnosis present

## 2013-05-13 DIAGNOSIS — D518 Other vitamin B12 deficiency anemias: Secondary | ICD-10-CM

## 2013-05-13 DIAGNOSIS — F0390 Unspecified dementia without behavioral disturbance: Secondary | ICD-10-CM

## 2013-05-13 LAB — CBC WITH DIFFERENTIAL/PLATELET
Eosinophils Absolute: 0 10*3/uL (ref 0.0–0.7)
Eosinophils Relative: 0 % (ref 0–5)
Hemoglobin: 12.5 g/dL (ref 12.0–15.0)
Lymphocytes Relative: 20 % (ref 12–46)
Lymphs Abs: 1.9 10*3/uL (ref 0.7–4.0)
MCH: 33.4 pg (ref 26.0–34.0)
MCV: 93 fL (ref 78.0–100.0)
Monocytes Relative: 13 % — ABNORMAL HIGH (ref 3–12)
Neutrophils Relative %: 67 % (ref 43–77)
Platelets: 236 10*3/uL (ref 150–400)
RBC: 3.74 MIL/uL — ABNORMAL LOW (ref 3.87–5.11)
WBC: 9.9 10*3/uL (ref 4.0–10.5)

## 2013-05-13 LAB — URINALYSIS, ROUTINE W REFLEX MICROSCOPIC
Bilirubin Urine: NEGATIVE
Glucose, UA: NEGATIVE mg/dL
Hgb urine dipstick: NEGATIVE
Ketones, ur: 15 mg/dL — AB
Protein, ur: NEGATIVE mg/dL
Urobilinogen, UA: 1 mg/dL (ref 0.0–1.0)

## 2013-05-13 LAB — CBC
HCT: 32.8 % — ABNORMAL LOW (ref 36.0–46.0)
Hemoglobin: 11.4 g/dL — ABNORMAL LOW (ref 12.0–15.0)
RBC: 3.58 MIL/uL — ABNORMAL LOW (ref 3.87–5.11)
RDW: 13.5 % (ref 11.5–15.5)
WBC: 9.2 10*3/uL (ref 4.0–10.5)

## 2013-05-13 LAB — COMPREHENSIVE METABOLIC PANEL
ALT: 16 U/L (ref 0–35)
Alkaline Phosphatase: 75 U/L (ref 39–117)
BUN: 14 mg/dL (ref 6–23)
CO2: 24 mEq/L (ref 19–32)
GFR calc Af Amer: 64 mL/min — ABNORMAL LOW (ref >90)
GFR calc non Af Amer: 55 mL/min — ABNORMAL LOW (ref >90)
Glucose, Bld: 92 mg/dL (ref 70–99)
Potassium: 4.5 mEq/L (ref 3.5–5.1)
Sodium: 134 mEq/L — ABNORMAL LOW (ref 135–145)
Total Protein: 6.8 g/dL (ref 6.0–8.3)

## 2013-05-13 LAB — POCT I-STAT 3, ART BLOOD GAS (G3+)
Bicarbonate: 23.1 mEq/L (ref 20.0–24.0)
Patient temperature: 97.9
pCO2 arterial: 28.9 mmHg — ABNORMAL LOW (ref 35.0–45.0)
pH, Arterial: 7.509 — ABNORMAL HIGH (ref 7.350–7.450)
pO2, Arterial: 57 mmHg — ABNORMAL LOW (ref 80.0–100.0)

## 2013-05-13 LAB — CREATININE, SERUM
GFR calc Af Amer: 71 mL/min — ABNORMAL LOW (ref >90)
GFR calc non Af Amer: 62 mL/min — ABNORMAL LOW (ref >90)

## 2013-05-13 LAB — TROPONIN I: Troponin I: <0.3 ng/mL (ref <0.30)

## 2013-05-13 MED ORDER — BRIMONIDINE TARTRATE-TIMOLOL 0.2-0.5 % OP SOLN: 1.0000 [drp] | Freq: Two times a day (BID) | OPHTHALMIC | Status: DC

## 2013-05-13 MED ORDER — LISINOPRIL 40 MG PO TABS
40.0000 mg | ORAL_TABLET | Freq: Every day | ORAL | Status: DC
  Administered 2013-05-14: 40 mg via ORAL
  Filled 2013-05-13: qty 1

## 2013-05-13 MED ORDER — ONDANSETRON HCL 4 MG/2ML IJ SOLN: 4.0000 mg | Freq: Three times a day (TID) | INTRAMUSCULAR | Status: AC | PRN

## 2013-05-13 MED ORDER — LORAZEPAM 0.5 MG PO TABS: 0.5000 mg | ORAL_TABLET | ORAL | Status: DC | PRN

## 2013-05-13 MED ORDER — LATANOPROST 0.005 % OP SOLN
1.0000 [drp] | Freq: Every day | OPHTHALMIC | Status: DC
Start: 2013-05-13 — End: 2013-05-14
  Administered 2013-05-13: 1 [drp] via OPHTHALMIC
  Filled 2013-05-13: qty 2.5

## 2013-05-13 MED ORDER — LORAZEPAM 2 MG/ML PO CONC: 0.5000 mg | ORAL | Status: DC | PRN

## 2013-05-13 MED ORDER — TIMOLOL MALEATE 0.5 % OP SOLN
1.0000 [drp] | Freq: Two times a day (BID) | OPHTHALMIC | Status: DC
  Administered 2013-05-13 – 2013-05-14 (×2): 1 [drp] via OPHTHALMIC
  Filled 2013-05-13: qty 5

## 2013-05-13 MED ORDER — CITALOPRAM HYDROBROMIDE 20 MG PO TABS
20.0000 mg | ORAL_TABLET | Freq: Two times a day (BID) | ORAL | Status: DC
  Administered 2013-05-14: 20 mg via ORAL
  Filled 2013-05-13 (×3): qty 1

## 2013-05-13 MED ORDER — SODIUM CHLORIDE 0.9 % IV SOLN: INTRAVENOUS | Status: DC

## 2013-05-13 MED ORDER — SIMVASTATIN 40 MG PO TABS
40.0000 mg | ORAL_TABLET | Freq: Every day | ORAL | Status: DC
  Administered 2013-05-14: 40 mg via ORAL
  Filled 2013-05-13 (×2): qty 1

## 2013-05-13 MED ORDER — ENOXAPARIN SODIUM 40 MG/0.4ML ~~LOC~~ SOLN
40.0000 mg | SUBCUTANEOUS | Status: DC
  Administered 2013-05-13: 40 mg via SUBCUTANEOUS
  Filled 2013-05-13 (×2): qty 0.4

## 2013-05-13 MED ORDER — VITAMIN D3 25 MCG (1000 UNIT) PO TABS
8000.0000 [IU] | ORAL_TABLET | Freq: Every day | ORAL | Status: DC
  Administered 2013-05-14: 8000 [IU] via ORAL
  Filled 2013-05-13: qty 8

## 2013-05-13 MED ORDER — BUMETANIDE 1 MG PO TABS
1.0000 mg | ORAL_TABLET | ORAL | Status: DC
  Filled 2013-05-13: qty 1

## 2013-05-13 MED ORDER — BRIMONIDINE TARTRATE 0.2 % OP SOLN
1.0000 [drp] | Freq: Two times a day (BID) | OPHTHALMIC | Status: DC
  Administered 2013-05-13 – 2013-05-14 (×2): 1 [drp] via OPHTHALMIC
  Filled 2013-05-13: qty 5

## 2013-05-13 MED ORDER — ATENOLOL 50 MG PO TABS
50.0000 mg | ORAL_TABLET | Freq: Every day | ORAL | Status: DC
  Administered 2013-05-14: 50 mg via ORAL
  Filled 2013-05-13: qty 1

## 2013-05-13 MED ORDER — DOCUSATE SODIUM 100 MG PO CAPS: 100.0000 mg | ORAL_CAPSULE | Freq: Every day | ORAL | Status: DC | PRN

## 2013-05-13 MED ORDER — SODIUM CHLORIDE 0.9 % IV SOLN
INTRAVENOUS | Status: AC
  Administered 2013-05-13: 17:00:00 via INTRAVENOUS

## 2013-05-13 NOTE — ED Provider Notes (Signed)
History    CSN: [id number] Arrival date & time 05/13/13  1150  First MD Initiated Contact with Patient 05/13/13 1207     Chief Complaint  Patient presents with  . Altered Mental Status   (Consider location/radiation/quality/duration/timing/severity/associated sxs/prior Treatment) HPI Comments: Patient from nursing home with apparent change in level of consciousness. She is unable to give any history. She has dementia and is onset of bleeding medications including Ativan, Seroquel.  Discussed with nurse at living facility. She reports patient had "labored breathing" intermittently all night and has been reaching for objects more than usual. She seemed increasingly confused this morning and was pointing and reaching for objects and did not swallow her breakfast which is held in her mouth. Denies any cough or fever. She received Ativan last night for the first time and they think it helped her.  The history is provided by the patient, a caregiver, the nursing home and the EMS personnel. The history is limited by the condition of the patient.   Past Medical History  Diagnosis Date  . Hypertension   . Dementia     Possible Lewy body dementia  . Hyperlipidemia   . Diabetes   . CAD (coronary artery disease)   . Degenerative arthritis   . Gait disorder   . Glaucoma   . Dementia with Lewy bodies 03/27/2013   Past Surgical History  Procedure Laterality Date  . Abdominal hysterectomy      for uterine fibroids  . Coronary artery bypass graft    . Total knee arthroplasty Left   . Cataract extraction, bilateral     Family History  Problem Relation Age of Onset  . Stroke Father   . Cancer Brother   . Heart failure Brother    History  Substance Use Topics  . Smoking status: Never Smoker   . Smokeless tobacco: Never Used  . Alcohol Use: No   OB History   Grav Para Term Preterm Abortions TAB SAB Ect Mult Living                 Review of Systems  Unable to perform ROS: Dementia   Psychiatric/Behavioral: Positive for altered mental status.    Allergies  Seroquel  Home Medications   Current Outpatient Rx  Name  Route  Sig  Dispense  Refill  . acetaminophen (TYLENOL) 325 MG tablet   Oral   Take 650 mg by mouth every 4 (four) hours as needed for pain.         Marland Kitchen atenolol (TENORMIN) 100 MG tablet   Oral   Take 50 mg by mouth daily.         . brimonidine-timolol (COMBIGAN) 0.2-0.5 % ophthalmic solution   Right Eye   Place 1 drop into the right eye 2 (two) times daily. Wait 3-5 minutes between eye drops.         . bumetanide (BUMEX) 1 MG tablet   Oral   Take 1 mg by mouth 3 (three) times a week. Monday, Wednesday, and Friday         . cholecalciferol (VITAMIN D) 1000 UNITS tablet   Oral   Take 8,000 Units by mouth daily.         . citalopram (CELEXA) 20 MG tablet   Oral   Take 20 mg by mouth 2 (two) times daily.         Marland Kitchen docusate sodium (COLACE) 100 MG capsule   Oral   Take 100 mg by mouth daily  as needed for constipation.         Marland Kitchen latanoprost (XALATAN) 0.005 % ophthalmic solution   Right Eye   Place 1 drop into the right eye at bedtime.         Marland Kitchen lisinopril (PRINIVIL,ZESTRIL) 40 MG tablet   Oral   Take 40 mg by mouth daily.         Marland Kitchen LORazepam (ATIVAN) 2 MG/ML concentrated solution   Oral   Take 0.5 mg by mouth every 4 (four) hours as needed (agitation).         . Multiple Vitamins-Minerals (CERTAVITE/ANTIOXIDANTS PO)   Oral   Take 1 tablet by mouth daily.         . Multiple Vitamins-Minerals (OCUVITE PRESERVISION PO)   Oral   Take 1 tablet by mouth daily.         . simvastatin (ZOCOR) 40 MG tablet   Oral   Take 40 mg by mouth daily.          BP 158/75  Pulse 77  Temp(Src) 97.9 F (36.6 C) (Axillary)  Resp 27  SpO2 90% Physical Exam  Constitutional: She appears well-developed and well-nourished. No distress.  Sleepy, arousable. Grunts, does not speak or follow commands  HENT:  Head: Normocephalic.   Mouth/Throat: Oropharynx is clear and moist. No oropharyngeal exudate.  Eyes: Conjunctivae and EOM are normal. Pupils are equal, round, and reactive to light.  Neck: Normal range of motion. Neck supple.  Cardiovascular: Normal rate, regular rhythm and normal heart sounds.   No murmur heard. Pulmonary/Chest: Effort normal and breath sounds normal. No respiratory distress. She has no wheezes.  Abdominal: Soft. There is no tenderness. There is no rebound and no guarding.  Musculoskeletal: Normal range of motion. She exhibits no edema and no tenderness.  Neurological: She is alert. No cranial nerve deficit. She exhibits normal muscle tone. Coordination normal.  Follows some commands  Skin: Skin is warm.    ED Course  Procedures (including critical care time) Labs Reviewed  CBC WITH DIFFERENTIAL - Abnormal; Notable for the following:    RBC 3.74 (*)    HCT 34.8 (*)    Monocytes Relative 13 (*)    Monocytes Absolute 1.3 (*)    All other components within normal limits  COMPREHENSIVE METABOLIC PANEL - Abnormal; Notable for the following:    Sodium 134 (*)    Albumin 3.4 (*)    GFR calc non Af Amer 55 (*)    GFR calc Af Amer 64 (*)    All other components within normal limits  URINALYSIS, ROUTINE W REFLEX MICROSCOPIC - Abnormal; Notable for the following:    Ketones, ur 15 (*)    All other components within normal limits  POCT I-STAT 3, BLOOD GAS (G3+) - Abnormal; Notable for the following:    pH, Arterial 7.509 (*)    pCO2 arterial 28.9 (*)    pO2, Arterial 57.0 (*)    All other components within normal limits  TROPONIN I  LACTIC ACID, PLASMA   Dg Chest 1 View  05/13/2013   *RADIOLOGY REPORT*  Clinical Data: Altered mental status.  CHEST - 1 VIEW  Comparison: 02/27/2013.  Findings: Trachea is midline.  Heart size stable.  Thoracic aorta is calcified. Pulmonary arteries appear mildly prominent.  Possible minimal bibasilar atelectasis.  Lungs are otherwise clear.  No pleural fluid.   IMPRESSION: Probable mild bibasilar atelectasis.   Original Report Authenticated By: Leanna Battles, M.D.   Ct Head Wo Contrast  05/13/2013   *RADIOLOGY REPORT*  Clinical Data: Altered mental status.  CT HEAD WITHOUT CONTRAST  Technique:  Contiguous axial images were obtained from the base of the skull through the vertex without contrast.  Comparison: 02/27/2013.  Findings: Motion degraded exam.  Global atrophy without hydrocephalus.  Small vessel disease type changes without CT evidence of large acute infarct.  No intracranial hemorrhage.  No intracranial mass lesion detected on this unenhanced exam.  Mastoid air cells, middle ear cavities and visualized sinuses are clear.  Orbital structures appear grossly within normal limits.  IMPRESSION: Motion degraded exam.  No acute abnormality.  Please see above.   Original Report Authenticated By: Lacy Duverney, M.D.   1. Altered mental status   2. Dementia, without behavioral disturbance     MDM  From nursing home with change in mental status. Unable to give a history.    CT head not acute. Chest x-ray negative. Labs appear to be at baseline. Respiratory alkalosis secondary to hyperventilation. An ABG  No evidence of UTI. Patient mental status may represent worsening dementia versus medication effect. She was recently seen in neurology office and found to be sedated. Her seroquel was discontinued.   No evidence of metabolic or infectious cause. Aspiration considered given that patient arrived with food in her mouth. Observation discussed with Dr. Red Christians.   Date: 05/13/2013  Rate: 67  Rhythm: normal sinus rhythm  QRS Axis: normal  Intervals: normal  ST/T Wave abnormalities: normal  Conduction Disutrbances:none  Narrative Interpretation:   Old EKG Reviewed: unchanged  g    Glynn Octave, MD 05/13/13 2030

## 2013-05-13 NOTE — ED Notes (Signed)
Pt returned from xray

## 2013-05-13 NOTE — ED Notes (Signed)
Pt states she is not having any pain at this time and does not know why she is here. Pt is in and out of sleep while attempting to assess.

## 2013-05-13 NOTE — ED Notes (Signed)
Pt is not following commands. Unable to complete stroke swallow screen.

## 2013-05-13 NOTE — H&P (Signed)
[facility] History and Physical  [name] ZOX:[id number] DOB: 13-Feb-1918 DOA: 05/13/2013  Referring physician: Emergency Department PCP: Nadean Corwin, MD  Specialists:   Chief Complaint: Altered Mental Status  HPI: [name] is a 79 y.o. female with a history of dementia who presents to the emergency department from her nursing home with markedly acute mental status change. In the ED, the patient was found to have unremarkable bloodwork, no leukocytosis, UA, chest xray. Of note, the patient was given one dose of ativan the night prior to admission. Given the patient's mental status change, a more detailed history cannot be obtained.  Review of Systems: Cannot obtain due to mental status change  Past Medical History  Diagnosis Date  . Hypertension   . Dementia     Possible Lewy body dementia  . Hyperlipidemia   . Diabetes   . CAD (coronary artery disease)   . Degenerative arthritis   . Gait disorder   . Glaucoma   . Dementia with Lewy bodies 03/27/2013   Past Surgical History  Procedure Laterality Date  . Abdominal hysterectomy      for uterine fibroids  . Coronary artery bypass graft    . Total knee arthroplasty Left   . Cataract extraction, bilateral     Social History:  reports that she has never smoked. She has never used smokeless tobacco. She reports that she does not drink alcohol or use illicit drugs. SNF  Allergies  Allergen Reactions  . Seroquel (Quetiapine Fumarate)     Family History  Problem Relation Age of Onset  . Stroke Father   . Cancer Brother   . Heart failure Brother      Prior to Admission medications   Medication Sig Start Date End Date Taking? Authorizing Provider  acetaminophen (TYLENOL) 325 MG tablet Take 650 mg by mouth every 4 (four) hours as needed for pain.   Yes Historical Provider, MD  atenolol (TENORMIN) 100 MG tablet Take 50 mg by mouth daily.   Yes Historical Provider, MD  brimonidine-timolol (COMBIGAN)  0.2-0.5 % ophthalmic solution Place 1 drop into the right eye 2 (two) times daily. Wait 3-5 minutes between eye drops.   Yes Historical Provider, MD  bumetanide (BUMEX) 1 MG tablet Take 1 mg by mouth 3 (three) times a week. Monday, Wednesday, and Friday   Yes Historical Provider, MD  cholecalciferol (VITAMIN D) 1000 UNITS tablet Take 8,000 Units by mouth daily.   Yes Historical Provider, MD  citalopram (CELEXA) 20 MG tablet Take 20 mg by mouth 2 (two) times daily.   Yes Historical Provider, MD  docusate sodium (COLACE) 100 MG capsule Take 100 mg by mouth daily as needed for constipation.   Yes Historical Provider, MD  latanoprost (XALATAN) 0.005 % ophthalmic solution Place 1 drop into the right eye at bedtime.   Yes Historical Provider, MD  lisinopril (PRINIVIL,ZESTRIL) 40 MG tablet Take 40 mg by mouth daily.   Yes Historical Provider, MD  LORazepam (ATIVAN) 2 MG/ML concentrated solution Take 0.5 mg by mouth every 4 (four) hours as needed (agitation).   Yes Historical Provider, MD  Multiple Vitamins-Minerals (CERTAVITE/ANTIOXIDANTS PO) Take 1 tablet by mouth daily.   Yes Historical Provider, MD  Multiple Vitamins-Minerals (OCUVITE PRESERVISION PO) Take 1 tablet by mouth daily.   Yes Historical Provider, MD  simvastatin (ZOCOR) 40 MG tablet Take 40 mg by mouth daily.   Yes Historical Provider, MD   Physical Exam: Filed Vitals:   05/13/13 1615 05/13/13 1645 05/13/13  1700 05/13/13 1715  BP: 158/75 159/54 152/84 180/57  Pulse: 77 75 76 72  Temp:      TempSrc:      Resp: 27 19 20 16   SpO2: 90% 92% 91% 91%     General:  Awake, in nad  Eyes: PERRL  ENT: moist membranes, poor dentition  Neck: trachea midline, neck supple  Cardiovascular: regular, s1, s2  Respiratory: normal resp effort, no wheezing  Abdomen: soft, nondistended  Skin: no abnormal skin lesions seen  Musculoskeletal: perfused, no cyanosis  Psychiatric: cannot assess  Neurologic: cn2-12 grossly intact, strength and  sensation intact  Labs on Admission:  Basic Metabolic Panel:  Recent Labs Lab 05/13/13 1240  NA 134*  K 4.5  CL 96  CO2 24  GLUCOSE 92  BUN 14  CREATININE 0.88  CALCIUM 9.6   Liver Function Tests:  Recent Labs Lab 05/13/13 1240  AST 30  ALT 16  ALKPHOS 75  BILITOT 0.9  PROT 6.8  ALBUMIN 3.4*   No results found for this basename: LIPASE, AMYLASE,  in the last 168 hours No results found for this basename: AMMONIA,  in the last 168 hours CBC:  Recent Labs Lab 05/13/13 1240  WBC 9.9  NEUTROABS 6.6  HGB 12.5  HCT 34.8*  MCV 93.0  PLT 236   Cardiac Enzymes:  Recent Labs Lab 05/13/13 1240  TROPONINI <0.30    BNP (last 3 results) No results found for this basename: PROBNP,  in the last 8760 hours CBG: No results found for this basename: GLUCAP,  in the last 168 hours  Radiological Exams on Admission: Dg Chest 1 View  05/13/2013   *RADIOLOGY REPORT*  Clinical Data: Altered mental status.  CHEST - 1 VIEW  Comparison: 02/27/2013.  Findings: Trachea is midline.  Heart size stable.  Thoracic aorta is calcified. Pulmonary arteries appear mildly prominent.  Possible minimal bibasilar atelectasis.  Lungs are otherwise clear.  No pleural fluid.  IMPRESSION: Probable mild bibasilar atelectasis.   Original Report Authenticated By: Leanna Battles, M.D.   Ct Head Wo Contrast  05/13/2013   *RADIOLOGY REPORT*  Clinical Data: Altered mental status.  CT HEAD WITHOUT CONTRAST  Technique:  Contiguous axial images were obtained from the base of the skull through the vertex without contrast.  Comparison: 02/27/2013.  Findings: Motion degraded exam.  Global atrophy without hydrocephalus.  Small vessel disease type changes without CT evidence of large acute infarct.  No intracranial hemorrhage.  No intracranial mass lesion detected on this unenhanced exam.  Mastoid air cells, middle ear cavities and visualized sinuses are clear.  Orbital structures appear grossly within normal limits.   IMPRESSION: Motion degraded exam.  No acute abnormality.  Please see above.   Original Report Authenticated By: Lacy Duverney, M.D.    Assessment/Plan Principal Problem:   Altered mental status Active Problems:   HYPERLIPIDEMIA   HYPERTENSION   CEREBROVASCULAR DISEASE   Dementia with Lewy bodies   Altered mental status: - Unclear etiology, may be secondary to recent administration of benzo vs worsening dementia - Will avoid additional sedating meds and observe -Will admit to obs status  Dementia: -As above  HTN: -Stable. -Will cont current regimen and monitor  HLD: -Will cont statin per home regimen  Hx CVA: -Appears stable  Code Status: Full (must indicate code status--if unknown or must be presumed, indicate so) Family Communication: Pt in room (indicate person spoken with, if applicable, with phone number if by telephone) Disposition Plan: Pending (indicate anticipated LOS)  Time spent:  Harley Fitzwater, Scheryl Marten [facility] Pager [phone]  If 7PM-7AM, please contact night-coverage [email or web address] Password [facility] 05/13/2013, 5:23 PM

## 2013-05-13 NOTE — ED Notes (Signed)
Patient transported to X-ray 

## 2013-05-13 NOTE — ED Notes (Signed)
Pt comes from [facility] with a c/o per staff of labored breathing, slurred speech, and agitation several times this morning. Staff states after giving the pt ativan the episodes stopped. Pt has not complaints at this time, and speech is normal. Stroke scale negative. Vitals 117 cbg, 122/80, 95% RA, pulse 74.

## 2013-05-14 DIAGNOSIS — F0280 Dementia in other diseases classified elsewhere without behavioral disturbance: Secondary | ICD-10-CM | POA: Diagnosis not present

## 2013-05-14 DIAGNOSIS — R4182 Altered mental status, unspecified: Secondary | ICD-10-CM

## 2013-05-14 DIAGNOSIS — F0390 Unspecified dementia without behavioral disturbance: Secondary | ICD-10-CM

## 2013-05-14 LAB — GLUCOSE, CAPILLARY
Glucose-Capillary: 62 mg/dL — ABNORMAL LOW (ref 70–99)
Glucose-Capillary: 130 mg/dL — ABNORMAL HIGH (ref 70–99)
Glucose-Capillary: 75 mg/dL (ref 70–99)
Glucose-Capillary: 155 mg/dL — ABNORMAL HIGH (ref 70–99)

## 2013-05-14 LAB — CBC
Hemoglobin: 10.3 g/dL — ABNORMAL LOW (ref 12.0–15.0)
MCH: 32.8 pg (ref 26.0–34.0)
MCV: 93.3 fL (ref 78.0–100.0)
RBC: 3.14 MIL/uL — ABNORMAL LOW (ref 3.87–5.11)

## 2013-05-14 LAB — COMPREHENSIVE METABOLIC PANEL
AST: 18 U/L (ref 0–37)
Albumin: 2.8 g/dL — ABNORMAL LOW (ref 3.5–5.2)
Calcium: 9.1 mg/dL (ref 8.4–10.5)
Chloride: 106 mEq/L (ref 96–112)
Creatinine, Ser: 0.86 mg/dL (ref 0.50–1.10)
Total Bilirubin: 1.2 mg/dL (ref 0.3–1.2)

## 2013-05-14 LAB — MRSA PCR SCREENING: MRSA by PCR: NEGATIVE

## 2013-05-14 MED ORDER — LORAZEPAM 2 MG/ML IJ SOLN
0.5000 mg | Freq: Once | INTRAMUSCULAR | Status: AC
  Administered 2013-05-14: 0.5 mg via INTRAVENOUS
  Filled 2013-05-14: qty 1

## 2013-05-14 MED ORDER — DEXTROSE 50 % IV SOLN
INTRAVENOUS | Status: AC
  Filled 2013-05-14: qty 50

## 2013-05-14 MED ORDER — DEXTROSE 50 % IV SOLN: 25.0000 mL | Freq: Once | INTRAVENOUS | Status: DC | PRN

## 2013-05-14 NOTE — Progress Notes (Signed)
OT Cancellation Note  Patient Details Name: [name] MRN: [id number] DOB: 22-Dec-1917   Cancelled Treatment:    Reason Eval/Treat Not Completed: OT screened, no needs identified, will sign off  Order received, reviewed chart. Pt for transfer to ALF. No acute OT needs identified. Will sign off.     Earlie Raveling OTR/L [phone] 05/14/2013, 1:22 PM

## 2013-05-14 NOTE — Evaluation (Signed)
Physical Therapy Evaluation Patient Details Name: [name] MRN: [id number] DOB: Jun 08, 1918 Today's Date: 05/14/2013 Time: 9528-4132 PT Time Calculation (min): 15 min  PT Assessment / Plan / Recommendation History of Present Illness  Pt adm from [facility] with altered mental status. CT negative.  Clinical Impression  Spoke with staff at [facility] and pt currently at baseline with mobility (w/c bound and requiring max A for transfers and total care for ADL's).  Did note rt arm weakness when trying to reach for walker. No further PT recommended.    PT Assessment  Patent does not need any further PT services    Follow Up Recommendations  No PT follow up    Does the patient have the potential to tolerate intense rehabilitation      Barriers to Discharge        Equipment Recommendations  None recommended by PT    Recommendations for Other Services     Frequency      Precautions / Restrictions Precautions Precautions: Fall Restrictions Weight Bearing Restrictions: No   Pertinent Vitals/Pain See flow sheet.      Mobility  Bed Mobility Bed Mobility: Rolling Right;Rolling Left;Supine to Sit;Sitting - Scoot to [place] of Bed;Sit to Supine Rolling Right: 3: Mod assist;With rail Rolling Left: 3: Mod assist;With rail Supine to Sit: 3: Mod assist;HOB elevated Sitting - Scoot to Edge of Bed: 4: Min assist Sit to Supine: 3: Mod assist;HOB flat Details for Bed Mobility Assistance: Assist to bring trunk up into sitting and to bring legs off EOB. Transfers Transfers: Sit to Stand;Stand to Sit Sit to Stand: 2: Max assist;From bed Stand to Sit: 2: Max assist;To bed Details for Transfer Assistance: Attempted stand with walker with pt unable to fully achieve.  Without walker pt able to come to stand with posterior lean against bed.    Exercises     PT Diagnosis:    PT Problem List:   PT Treatment Interventions:       PT Goals(Current goals can be found in the care  plan section)    Visit Information  Last PT Received On: 05/14/13 Assistance Needed: +1 History of Present Illness: Pt adm from [facility] with altered mental status. CT negative.       Prior Functioning  Home Living Family/patient expects to be discharged to:: Assisted living [facility]) Home Equipment: Wheelchair - manual Prior Function Level of Independence: Needs assistance Gait / Transfers Assistance Needed: max A for bed to w/c transfers ADL's / Homemaking Assistance Needed: pt was total assist for ADL's. Communication / Swallowing Assistance Needed: Staff at ALF report pt needed frequent cues to continue to chew her food.    Cognition  Cognition Arousal/Alertness: Awake/alert Behavior During Therapy: WFL for tasks assessed/performed Overall Cognitive Status: History of cognitive impairments - at baseline    Extremity/Trunk Assessment Upper Extremity Assessment Upper Extremity Assessment: Difficult to assess due to impaired cognition;RUE deficits/detail RUE Deficits / Details: Functionally noted pt with difficulty placing hand on walker. Lower Extremity Assessment Lower Extremity Assessment: RLE deficits/detail;LLE deficits/detail RLE Deficits / Details: functionally <3/5 LLE Deficits / Details: functionally at least 3/5   Balance Balance Balance Assessed: Yes Static Sitting Balance Static Sitting - Balance Support: Bilateral upper extremity supported Static Sitting - Level of Assistance: 5: Stand by assistance Static Sitting - Comment/# of Minutes: sat EOB 5-6 minutes Static Standing Balance Static Standing - Balance Support: No upper extremity supported Static Standing - Level of Assistance: 3: Mod assist  End of  Session PT - End of Session Equipment Utilized During Treatment: Gait belt Activity Tolerance: Patient tolerated treatment well Patient left: in bed;with call bell/phone within reach;with bed alarm set Nurse Communication: Mobility status   GP Functional Assessment Tool Used: clinical judgement Functional Limitation: Mobility: Walking and moving around Mobility: Walking and Moving Around Current Status (W2956): At least 60 percent but less than 80 percent impaired, limited or restricted Mobility: Walking and Moving Around Goal Status [phone]): At least 60 percent but less than 80 percent impaired, limited or restricted Mobility: Walking and Moving Around Discharge Status [phone]): At least 60 percent but less than 80 percent impaired, limited or restricted   [facility] 05/14/2013, 10:10 AM  [facility] PT [phone]

## 2013-05-14 NOTE — Evaluation (Addendum)
Clinical/Bedside Swallow Evaluation Patient Details  Name: [name] MRN: [id number] Date of Birth: 02/17/1918  Today's Date: 05/14/2013 Time: 1015-1039 SLP Time Calculation (min): 24 min  Past Medical History:  Past Medical History  Diagnosis Date  . Hypertension   . Dementia     Possible Lewy body dementia  . Hyperlipidemia   . Diabetes   . CAD (coronary artery disease)   . Degenerative arthritis   . Gait disorder   . Glaucoma   . Dementia with Lewy bodies 03/27/2013   Past Surgical History:  Past Surgical History  Procedure Laterality Date  . Abdominal hysterectomy      for uterine fibroids  . Coronary artery bypass graft    . Total knee arthroplasty Left   . Cataract extraction, bilateral     HPI:  [name] is a 79 y.o. female with a history of dementia who presents to the emergency department from her nursing home with markedly acute mental status change. In the ED, the patient was found to have unremarkable bloodwork, no leukocytosis, UA, chest xray. Of note, the patient was given one dose of ativan the night prior to admission.  MRI revealed No acute abnormality   Assessment / Plan / Recommendation Clinical Impression  No overt s/s of aspiration, the pt. tolerate rapid successive thin liquid straw sips without overt difficulty. Mildly reduced mastication of graham cracker, with prolonged mastication. Pt benefited friom alternating solids with liquids. Oral phase slightly slow with puree, but functional.. Varied levels of altertness may affect PO intake.    Aspiration Risk  Mild    Diet Recommendation Dysphagia 2 (Fine chop);Thin liquid   Liquid Administration via: Spoon Medication Administration: Crushed with puree Supervision: Trained caregiver to feed patient Compensations: Check for pocketing;Follow solids with liquid;Slow rate;Small sips/bites Postural Changes and/or Swallow Maneuvers: Seated upright 90 degrees;Upright 30-60 min after meal    Other   Recommendations Oral Care Recommendations: Oral care BID   Follow Up Recommendations  Skilled Nursing facility    Frequency and Duration min 2x/week  1 week   Pertinent Vitals/Pain Pt. Denied pain    SLP Swallow Goals Patient will consume recommended diet without observed clinical signs of aspiration with: Maximum assistance Swallow Study Goal #1 - Progress: Partly Met Patient will utilize recommended strategies during swallow to increase swallowing safety with: Maximum assistance Swallow Study Goal #2 - Progress: Partly met   Swallow Study Prior Functional Status       General Date of Onset: 05/13/13 HPI: [name] is a 79 y.o. female with a history of dementia who presents to the emergency department from her nursing home with markedly acute mental status change. In the ED, the patient was found to have unremarkable bloodwork, no leukocytosis, UA, chest xray. Of note, the patient was given one dose of ativan the night prior to admission.  MRI revealed No acute abnormality Type of Study: Bedside swallow evaluation Previous Swallow Assessment: MBS 2006, results n/a Diet Prior to this Study: Dysphagia 1 (puree);Thin liquids Temperature Spikes Noted: No Respiratory Status: Supplemental O2 delivered via (comment) History of Recent Intubation: No Behavior/Cognition: Alert;Confused;Pleasant mood Oral Cavity - Dentition: Dentures, top Self-Feeding Abilities: Total assist Patient Positioning: Upright in bed Baseline Vocal Quality: Clear Volitional Cough: Strong Volitional Swallow: Able to elicit    Oral/Motor/Sensory Function Overall Oral Motor/Sensory Function: Appears within functional limits for tasks assessed Labial ROM: Within Functional Limits Lingual ROM: Within Functional Limits Facial ROM: Within Functional Limits Mandible: Within Functional Limits  Ice Chips Ice chips: Not tested   Thin Liquid Thin Liquid: Within functional limits Presentation: Straw (multiple  rapid successive straw sips )    Nectar Thick Nectar Thick Liquid: Not tested   Honey Thick Honey Thick Liquid: Not tested   Puree Puree: Within functional limits Presentation: Spoon   Solid   GO Functional Assessment Tool Used: clinical judgement Functional Limitations: Swallowing Swallow Current Status (N8295): At least 40 percent but less than 60 percent impaired, limited or restricted Swallow Goal Status [phone]): At least 40 percent but less than 60 percent impaired, limited or restricted Swallow Discharge Status [phone]): At least 40 percent but less than 60 percent impaired, limited or restricted  Solid: Impaired Presentation:  (graham cracker) Oral Phase Impairments: Reduced lingual movement/coordination Oral Phase Functional Implications: Oral holding;Other (comment) (extraneous chewing after bolus had cleared)       Antonella Upson, Radene Journey 05/14/2013,11:05 AM

## 2013-05-14 NOTE — Progress Notes (Signed)
Pt being pick up by [facility] back to [facility] where she came from pt refused dinner condition at pt baseline  No acute distress noted

## 2013-05-14 NOTE — Progress Notes (Signed)
Utilization review completed. Clifton Kovacic, RN, BSN. 

## 2013-05-14 NOTE — Progress Notes (Signed)
Pt d/c to [facility] report called and spoke with [facility]  The receiving Nurse

## 2013-05-14 NOTE — Progress Notes (Signed)
RN notify pt family that pt being d/c to [facility] facility .Spoke with Granddaughter Pam  Peddy

## 2013-05-14 NOTE — Progress Notes (Signed)
Hypoglycemic Event  CBG: 62  Treatment: D50 IV 25 mL  Symptoms: None  Follow-up CBG: Time:0645 CBG Result:130  Possible Reasons for Event: Inadequate meal intake  Comments/MD notified:note written to MD about changing pt's fluids to D5    Rogene Houston, Salena Saner  Remember to initiate Hypoglycemia Order Set & complete

## 2013-05-14 NOTE — Discharge Summary (Signed)
Physician Discharge Summary  [name] VWU:[id number] DOB: [birth year] DOA: 05/13/2013  PCP: Nadean Corwin, MD  Admit date: 05/13/2013 Discharge date: 05/14/2013  Recommendations for Outpatient Follow-up:   Discharge Diagnoses:  Principal Problem:   Altered mental status Active Problems:   HYPERLIPIDEMIA   HYPERTENSION   CEREBROVASCULAR DISEASE   Dementia with Lewy bodies hypoglycemia  Discharge Condition: stable    History of present illness:  [name] is a 79 y.o. female with a history of dementia who presents to the emergency department from her nursing home with markedly acute mental status change. In the ED, the patient was found to have unremarkable bloodwork, no leukocytosis, UA, chest xray. Of note, the patient was given one dose of ativan the night prior to admission. Given the patient's mental status change, a more detailed history cannot be obtained.  Hospital Course:  Placed on observation.  speech therapy was consulted, as there were some reports food pocketing. See her recommendations below. Patient has been stable. Mental status change may have been medication related. No new issues. Medically stable.  Contrast her back to facility. Family member reports that patient's CODE STATUS is DO NOT RESUSCITATE.  Procedures:  none  Consultations:  Speech therapy. Social work  Discharge Exam: Filed Vitals:   05/13/13 2234 05/14/13 0245 05/14/13 0633 05/14/13 1000  BP: 117/56 107/45 131/60 179/73  Pulse: 72 73 74 67  Temp: 99 F (37.2 C) 98.8 F (37.1 C) 99.3 F (37.4 C) 98.6 F (37 C)  TempSrc: Oral Oral Oral Oral  Resp: 18 20 18 18   SpO2: 91% 96% 100% 100%    General: asleep. Arousable. Confused.  Respiratory: regular rate rhythm without murmurs gallops rubs Neurologic: No obvious cranial nerve or motor deficits  Discharge Instructions  Discharge Orders   Future Appointments Provider Department Dept Phone   07/27/2013 2:00 PM York Spaniel, MD [facility] [phone]   Future Orders Complete By Expires     Discharge instructions  As directed     Comments:      DIet: Dysphagia 2 (Fine chop);Thin liquid  Liquid Administration via: Spoon Medication Administration: Crushed with puree Supervision: Trained caregiver to feed patient Compensations: Check for pocketing;Follow solids with liquid;Slow rate;Small sips/bites Postural Changes and/or Swallow Maneuvers: Seated upright 90 degrees;Upright 30-60 min after meal    Walk with assistance  As directed         Medication List    STOP taking these medications       CERTAVITE/ANTIOXIDANTS PO     LORazepam 2 MG/ML concentrated solution  Commonly known as:  ATIVAN      TAKE these medications       acetaminophen 325 MG tablet  Commonly known as:  TYLENOL  Take 650 mg by mouth every 4 (four) hours as needed for pain.     atenolol 100 MG tablet  Commonly known as:  TENORMIN  Take 50 mg by mouth daily.     bumetanide 1 MG tablet  Commonly known as:  BUMEX  Take 1 mg by mouth 3 (three) times a week. Monday, Wednesday, and Friday     cholecalciferol 1000 UNITS tablet  Commonly known as:  VITAMIN D  Take 8,000 Units by mouth daily.     citalopram 20 MG tablet  Commonly known as:  CELEXA  Take 20 mg by mouth 2 (two) times daily.     COMBIGAN 0.2-0.5 % ophthalmic solution  Generic drug:  brimonidine-timolol  Place 1 drop into  the right eye 2 (two) times daily. Wait 3-5 minutes between eye drops.     docusate sodium 100 MG capsule  Commonly known as:  COLACE  Take 100 mg by mouth daily as needed for constipation.     latanoprost 0.005 % ophthalmic solution  Commonly known as:  XALATAN  Place 1 drop into the right eye at bedtime.     lisinopril 40 MG tablet  Commonly known as:  PRINIVIL,ZESTRIL  Take 40 mg by mouth daily.     OCUVITE PRESERVISION PO  Take 1 tablet by mouth daily.     simvastatin 40 MG tablet  Commonly known as:   ZOCOR  Take 40 mg by mouth daily.       Allergies  Allergen Reactions  . Seroquel (Quetiapine Fumarate)        Follow-up Information   Follow up with Nadean Corwin, MD In 1 week.   Contact information:   [address] [place] [address] [phone]        The results of significant diagnostics from this hospitalization (including imaging, microbiology, ancillary and laboratory) are listed below for reference.    Significant Diagnostic Studies: Dg Chest 1 View  05/13/2013   *RADIOLOGY REPORT*  Clinical Data: Altered mental status.  CHEST - 1 VIEW  Comparison: 02/27/2013.  Findings: Trachea is midline.  Heart size stable.  Thoracic aorta is calcified. Pulmonary arteries appear mildly prominent.  Possible minimal bibasilar atelectasis.  Lungs are otherwise clear.  No pleural fluid.  IMPRESSION: Probable mild bibasilar atelectasis.   Original Report Authenticated By: Leanna Battles, M.D.   Ct Head Wo Contrast  05/13/2013   *RADIOLOGY REPORT*  Clinical Data: Altered mental status.  CT HEAD WITHOUT CONTRAST  Technique:  Contiguous axial images were obtained from the base of the skull through the vertex without contrast.  Comparison: 02/27/2013.  Findings: Motion degraded exam.  Global atrophy without hydrocephalus.  Small vessel disease type changes without CT evidence of large acute infarct.  No intracranial hemorrhage.  No intracranial mass lesion detected on this unenhanced exam.  Mastoid air cells, middle ear cavities and visualized sinuses are clear.  Orbital structures appear grossly within normal limits.  IMPRESSION: Motion degraded exam.  No acute abnormality.  Please see above.   Original Report Authenticated By: Lacy Duverney, M.D.    Microbiology: Recent Results (from the past 240 hour(s))  MRSA PCR SCREENING     Status: None   Collection Time    05/13/13  9:08 PM      Result Value Range Status   MRSA by PCR NEGATIVE  NEGATIVE Final   Comment:             The GeneXpert MRSA Assay (FDA     approved for NASAL specimens     only), is one component of a     comprehensive MRSA colonization     surveillance program. It is not     intended to diagnose MRSA     infection nor to guide or     monitor treatment for     MRSA infections.     Labs: Basic Metabolic Panel:  Recent Labs Lab 05/13/13 1240 05/13/13 1742 05/14/13 0604  NA 134*  --  139  K 4.5  --  3.9  CL 96  --  106  CO2 24  --  26  GLUCOSE 92  --  71  BUN 14  --  12  CREATININE 0.88 0.80 0.86  CALCIUM 9.6  --  9.1   Liver Function Tests:  Recent Labs Lab 05/13/13 1240 05/14/13 0604  AST 30 18  ALT 16 15  ALKPHOS 75 58  BILITOT 0.9 1.2  PROT 6.8 5.6*  ALBUMIN 3.4* 2.8*   No results found for this basename: LIPASE, AMYLASE,  in the last 168 hours No results found for this basename: AMMONIA,  in the last 168 hours CBC:  Recent Labs Lab 05/13/13 1240 05/13/13 1742 05/14/13 0604  WBC 9.9 9.2 7.0  NEUTROABS 6.6  --   --   HGB 12.5 11.4* 10.3*  HCT 34.8* 32.8* 29.3*  MCV 93.0 91.6 93.3  PLT 236 211 206   Cardiac Enzymes:  Recent Labs Lab 05/13/13 1240  TROPONINI <0.30   BNP: BNP (last 3 results) No results found for this basename: PROBNP,  in the last 8760 hours CBG:  Recent Labs Lab 05/14/13 0308 05/14/13 0601 05/14/13 0638 05/14/13 0841 05/14/13 1154  GLUCAP 72 62* 130* 75 155*   Signed:  Golden Emile L  [facility] 05/14/2013, 12:12 PM

## 2013-05-14 NOTE — Clinical Social Work Psychosocial (Signed)
Clinical Social Work Department BRIEF PSYCHOSOCIAL ASSESSMENT 05/14/2013  Patient:  [name], [name]     Account Number:  [id number]     Admit date:  05/13/2013  Clinical Social Worker:  Read Drivers  Date/Time:  05/14/2013 11:58 AM  Referred by:  Physician  Date Referred:  05/14/2013 Referred for  ALF Placement   Other Referral:   none   Interview type:  Other - See comment Other interview type:   pt; daughter Elonda Husky and granddaughter Pam    PSYCHOSOCIAL DATA Living Status:  FACILITY Admitted from facility:  [facility] Level of care:  Assisted Living Primary support name:  Cassandra Primary support relationship to patient:  CHILD, ADULT Degree of support available:   good    CURRENT CONCERNS Current Concerns  Post-Acute Placement   Other Concerns:   none    SOCIAL WORK ASSESSMENT / PLAN CSW assessed pt at bedside.  Pt had difficult time understanding CSW.  CSW contacted daughter, Elonda Husky and granddaughter, Elita Quick.  Both were unaware that pt had been sent to the hospital.  CSW advised them she was here, but possibly ready to d/c back to the ALF.  Pt daughter and granddaughter appreciative of CSW support and assistance.   Assessment/plan status:  Psychosocial Support/Ongoing Assessment of Needs Other assessment/ plan:   none   Information/referral to community resources:   ALF    PATIENT'S/FAMILY'S RESPONSE TO PLAN OF CARE: Pt famlily was very appreciative of CSW assistance and support.     Vickii Penna, LCSWA [phone]  Clinical Social Work

## 2013-05-14 NOTE — Clinical Social Work Note (Signed)
Clinical Social Worker continuing to follow patient and family for support and discharge planning needs.  CSW facilitated patient discharge including contacting patient family and facility to confirm patient discharge plans.  Clinical information faxed to facility and family agreeable with plan.  CSW arranged ambulance transport via [facility] to [facility] .  RN to call report prior to discharge.  Clinical Social Worker will sign off for now as social work intervention is no longer needed. Please consult [place] again if new need arises.  Macario Golds, [place] [phone]

## 2013-06-05 ENCOUNTER — Emergency Department (HOSPITAL_COMMUNITY)
Admission: EM | Admit: 2013-06-05 | Discharge: 2013-06-05 | Disposition: A | Discharge status: Home / Self Care | Payer: Medicare Other | Attending: Emergency Medicine | Admitting: Emergency Medicine

## 2013-06-05 ENCOUNTER — Encounter (HOSPITAL_COMMUNITY): Payer: Self-pay | Admitting: Emergency Medicine

## 2013-06-05 DIAGNOSIS — Y9210 Unspecified residential institution as the place of occurrence of the external cause: Secondary | ICD-10-CM | POA: Insufficient documentation

## 2013-06-05 DIAGNOSIS — I2510 Atherosclerotic heart disease of native coronary artery without angina pectoris: Secondary | ICD-10-CM | POA: Insufficient documentation

## 2013-06-05 DIAGNOSIS — Z8739 Personal history of other diseases of the musculoskeletal system and connective tissue: Secondary | ICD-10-CM | POA: Insufficient documentation

## 2013-06-05 DIAGNOSIS — Z951 Presence of aortocoronary bypass graft: Secondary | ICD-10-CM | POA: Insufficient documentation

## 2013-06-05 DIAGNOSIS — F0390 Unspecified dementia without behavioral disturbance: Secondary | ICD-10-CM | POA: Insufficient documentation

## 2013-06-05 DIAGNOSIS — Z90710 Acquired absence of both cervix and uterus: Secondary | ICD-10-CM | POA: Insufficient documentation

## 2013-06-05 DIAGNOSIS — I10 Essential (primary) hypertension: Secondary | ICD-10-CM | POA: Insufficient documentation

## 2013-06-05 DIAGNOSIS — W19XXXA Unspecified fall, initial encounter: Secondary | ICD-10-CM

## 2013-06-05 DIAGNOSIS — Z043 Encounter for examination and observation following other accident: Secondary | ICD-10-CM | POA: Insufficient documentation

## 2013-06-05 DIAGNOSIS — E119 Type 2 diabetes mellitus without complications: Secondary | ICD-10-CM | POA: Insufficient documentation

## 2013-06-05 DIAGNOSIS — Z8669 Personal history of other diseases of the nervous system and sense organs: Secondary | ICD-10-CM | POA: Insufficient documentation

## 2013-06-05 DIAGNOSIS — Z79899 Other long term (current) drug therapy: Secondary | ICD-10-CM | POA: Insufficient documentation

## 2013-06-05 DIAGNOSIS — Y9389 Activity, other specified: Secondary | ICD-10-CM | POA: Insufficient documentation

## 2013-06-05 DIAGNOSIS — E785 Hyperlipidemia, unspecified: Secondary | ICD-10-CM | POA: Insufficient documentation

## 2013-06-05 DIAGNOSIS — W050XXA Fall from non-moving wheelchair, initial encounter: Secondary | ICD-10-CM | POA: Insufficient documentation

## 2013-06-05 NOTE — ED Notes (Signed)
Pt denies any pain at this time.

## 2013-06-05 NOTE — ED Notes (Signed)
Placed call to [facility] for transportation back home 

## 2013-06-05 NOTE — ED Notes (Signed)
Pt comes from [facility] with c/o unwitnessed fall from wheelchair. Staff unsure if pt hit her head. Pt denies any pain, follow commands and denies any complaints at this time. Pt has alzheimers.

## 2013-06-05 NOTE — ED Provider Notes (Signed)
History    CSN: [id number] Arrival date & time 06/05/13  1140  First MD Initiated Contact with Patient 06/05/13 1157     Chief Complaint  Patient presents with  . Fall   (Consider location/radiation/quality/duration/timing/severity/associated sxs/prior Treatment) Patient is a 79 y.o. female presenting with fall. The history is provided by the nursing home. No language interpreter was used.  Fall This is a new problem. The current episode started 1 to 2 hours ago (Pt reportedly slid out of her wheelchair at the nursing home.  No specific injury reported.). The problem occurs constantly. The problem has not changed since onset.Associated symptoms comments: None . Nothing aggravates the symptoms. Nothing relieves the symptoms. She has tried nothing (Pt brought for evaluation to [facility] ED by EMS.) for the symptoms. The treatment provided no relief.   Past Medical History  Diagnosis Date  . Hypertension   . Dementia     Possible Lewy body dementia  . Hyperlipidemia   . Diabetes   . CAD (coronary artery disease)   . Degenerative arthritis   . Gait disorder   . Glaucoma   . Dementia with Lewy bodies 03/27/2013   Past Surgical History  Procedure Laterality Date  . Abdominal hysterectomy      for uterine fibroids  . Coronary artery bypass graft    . Total knee arthroplasty Left   . Cataract extraction, bilateral     Family History  Problem Relation Age of Onset  . Stroke Father   . Cancer Brother   . Heart failure Brother    History  Substance Use Topics  . Smoking status: Never Smoker   . Smokeless tobacco: Never Used  . Alcohol Use: No   OB History   Grav Para Term Preterm Abortions TAB SAB Ect Mult Living                 Review of Systems  Unable to perform ROS: Dementia    Allergies  Seroquel  Home Medications   Current Outpatient Rx  Name  Route  Sig  Dispense  Refill  . atenolol (TENORMIN) 100 MG tablet   Oral   Take 50 mg by mouth daily.         . brimonidine-timolol (COMBIGAN) 0.2-0.5 % ophthalmic solution   Right Eye   Place 1 drop into the right eye 2 (two) times daily. Wait 3-5 minutes between eye drops.         . bumetanide (BUMEX) 1 MG tablet   Oral   Take 1 mg by mouth 3 (three) times a week. Monday, Wednesday, and Friday         . Cholecalciferol (VITAMIN D) 2000 UNITS CAPS   Oral   Take 8,000 Units by mouth daily.         . citalopram (CELEXA) 20 MG tablet   Oral   Take 20 mg by mouth 2 (two) times daily.         . hydrOXYzine (ATARAX/VISTARIL) 25 MG tablet   Oral   Take 25 mg by mouth 4 (four) times daily - after meals and at bedtime.         . hydrOXYzine (ATARAX/VISTARIL) 50 MG tablet   Oral   Take 50 mg by mouth at bedtime as needed (sleep).         Marland Kitchen ketoconazole (NIZORAL) 2 % cream   Topical   Apply 1 application topically daily. Apply to red area of abdomen for 14 days. Started at  06-01-13.         Marland Kitchen latanoprost (XALATAN) 0.005 % ophthalmic solution   Right Eye   Place 1 drop into the right eye at bedtime.         Marland Kitchen lisinopril (PRINIVIL,ZESTRIL) 40 MG tablet   Oral   Take 40 mg by mouth daily.         . Multiple Vitamins-Minerals (OCUVITE PRESERVISION PO)   Oral   Take 1 tablet by mouth daily.         . simvastatin (ZOCOR) 40 MG tablet   Oral   Take 40 mg by mouth every evening.          . Skin Protectants, Misc. (BAZA PROTECT EX)   Apply externally   Apply 1 application topically 3 (three) times daily.         Marland Kitchen docusate sodium (COLACE) 100 MG capsule   Oral   Take 100 mg by mouth daily as needed for constipation.          Pulse 53  Temp(Src) 98 F (36.7 C) (Oral)  Resp 14  SpO2 100% Physical Exam  Nursing note and vitals reviewed. Constitutional:  Slender elderly woman, no distress.  HENT:  Head: Normocephalic and atraumatic.  Right Ear: External ear normal.  Left Ear: External ear normal.  Mouth/Throat: Oropharynx is clear and moist.  Eyes:  Conjunctivae and EOM are normal.  Neck: Normal range of motion. Neck supple.  Cardiovascular: Normal rate, regular rhythm and normal heart sounds.   Pulmonary/Chest: Effort normal and breath sounds normal.  Abdominal: Soft. Bowel sounds are normal.  Musculoskeletal: Normal range of motion. She exhibits no edema and no tenderness.  No bony deformity or tenderness over neck, back, arrns or legs.  Neurological:  Pt awake, answers some questions.  No sensory or motor deficits.  Skin: Skin is warm and dry.  Psychiatric:  Unable to assess.    ED Course  Procedures (including critical care time)  Pt seen and physical exam performed.  No injury found.  Released.  1. Fall at nursing home, initial encounter        Carleene Cooper III, MD 06/06/13 [phone]

## 2013-06-05 NOTE — ED Notes (Signed)
P-Tar  Arrived for transport to [facility]

## 2013-06-05 NOTE — ED Notes (Signed)
Report given to [facility] RN at [facility].

## 2013-06-05 NOTE — ED Notes (Signed)
Dr. Davidson at bedside.

## 2013-06-05 NOTE — ED Notes (Signed)
Pt. s diaper changed and pericare given prior to pt. Being transferred to [facility],  Warm Blanket given

## 2013-06-05 NOTE — ED Notes (Signed)
Pt. Arrived to rm 25, Alert,oriented to self, not time or place.  WArm blanket given

## 2013-07-24 ENCOUNTER — Other Ambulatory Visit: Payer: Self-pay | Admitting: Family Medicine

## 2013-07-24 ENCOUNTER — Non-Acute Institutional Stay (INDEPENDENT_AMBULATORY_CARE_PROVIDER_SITE_OTHER): Payer: Medicare Other | Admitting: Family Medicine

## 2013-07-24 DIAGNOSIS — Z593 Problems related to living in residential institution: Secondary | ICD-10-CM

## 2013-07-24 DIAGNOSIS — Y9210 Unspecified residential institution as the place of occurrence of the external cause: Secondary | ICD-10-CM

## 2013-07-24 DIAGNOSIS — R4182 Altered mental status, unspecified: Secondary | ICD-10-CM

## 2013-07-24 NOTE — Progress Notes (Signed)
Family Medicine Teaching Service Nursing Home Admission History and Physical Service Pager: [phone]  Patient name: [name] Medical record number: [id number] Date of birth: 1918/04/06 Age: 79 y.o. Gender: female  Primary Care Provider: Nadean Corwin, MD Consultants: none Code Status:  DNR  Chief Complaint: Dementia  Assessment and Plan: [name] is a 79 y.o. female presenting to [facility] Nursing home for more intensive care due to progressive Dementia. PMH is significant for HTN, HLD, CAD s/p CABG 20years ago, Lewy Body Dementia, DM, Arthritis, Glaucoma, Hysterectomy, total knee replacement  Dementia: becoming less interactive at previous assisted living at [facility]. MMSE today of 13/29 (no clock/pentagon drawing). No medications for this previously and not sure if there would be any benefit. Pt very pleasant. Per Onalee Hua, pt can at times become agitated and not participate but otherwise pleasant. Monitor for now - remove sedating/anticholinergic meds where able - Continue scheduled/PRN atarax and lorazepam for now. Will discuss decreasing dose w/ team on Wednesday after pt has time to acclimate.  - consider DC celexa   Deconditioning/falls: concern for recent falls. Pt w/ excellent upper and lower body strength given age.  - PT ordered - Nutrition to assist per routine  CV: H/o HTN, HLD, and CAD hx s/p CABG - Continue Bumex, atenolol, lisinopril - Continue Zocor - Consider adding ASA 81.  - BMP, CBC  DM: No diabetic medications.  - CBG, A1c on admission  Optho: h/o glaucoma - continue home drops of Xalant and [place]  FEN/GI: No dietary restrictions per HCPOA.  - will DC docusate and start senna and miralax PRN  Disposition: likely long-term pt  History of Present Illness: Fatisha Rabalais Mccarthy is a 79 y.o. female presenting from [facility] Assisted living but has had increasing difficulty with ADLs and forgetfullness. No complaints on  admission. Feels well and very interactive  Review Of Systems: Per HPI with the following additions: Otherwise 12 point review of systems was performed and was unremarkable.  Patient Active Problem List   Diagnosis Date Noted  . Altered mental status 05/13/2013  . Dementia with Lewy bodies 03/27/2013  . Other general symptoms(780.99) 10/30/2012  . Other vitamin B12 deficiency anemia 10/30/2012  . Abnormality of gait 10/30/2012  . 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 HYPERLIPIDEMIA 10/20/2009  . HYPERTENSION 10/20/2009  . CAD 10/20/2009  . CEREBROVASCULAR DISEASE 10/20/2009   Past Medical History: Past Medical History  Diagnosis Date  . Hypertension   . Dementia     Possible Lewy body dementia  . Hyperlipidemia   . Diabetes   . CAD (coronary artery disease)   . Degenerative arthritis   . Gait disorder   . Glaucoma   . Dementia with Lewy bodies 03/27/2013   Past Surgical History: Past Surgical History  Procedure Laterality Date  . Abdominal hysterectomy      for uterine fibroids  . Coronary artery bypass graft    . Total knee arthroplasty Left   . Cataract extraction, bilateral     Social History: History  Substance Use Topics  . Smoking status: Never Smoker   . Smokeless tobacco: Never Used  . Alcohol Use: No   Additional social history:  Please also refer to relevant sections of EMR.  Family History: Family History  Problem Relation Age of Onset  . Stroke Father   . Cancer Brother   . Heart failure Brother    Allergies and Medications: Allergies  Allergen Reactions  .  Seroquel [Quetiapine Fumarate] Other (See Comments)    Unknown    Current Outpatient Prescriptions on File Prior to Visit  Medication Sig Dispense Refill  . atenolol (TENORMIN) 100 MG tablet Take 50 mg by mouth daily.      . brimonidine-timolol (COMBIGAN) 0.2-0.5 % ophthalmic solution Place 1 drop into the right eye 2 (two) times daily.  Wait 3-5 minutes between eye drops.      . bumetanide (BUMEX) 1 MG tablet Take 1 mg by mouth 3 (three) times a week. Monday, Wednesday, and Friday      . Cholecalciferol (VITAMIN D) 2000 UNITS CAPS Take 8,000 Units by mouth daily.      . citalopram (CELEXA) 20 MG tablet Take 20 mg by mouth 2 (two) times daily.      Marland Kitchen docusate sodium (COLACE) 100 MG capsule Take 100 mg by mouth daily as needed for constipation.      . hydrOXYzine (ATARAX/VISTARIL) 25 MG tablet Take 25 mg by mouth 4 (four) times daily - after meals and at bedtime.      . hydrOXYzine (ATARAX/VISTARIL) 50 MG tablet Take 50 mg by mouth at bedtime as needed (sleep).      Marland Kitchen ketoconazole (NIZORAL) 2 % cream Apply 1 application topically daily. Apply to red area of abdomen for 14 days. Started at 06-01-13.      Marland Kitchen latanoprost (XALATAN) 0.005 % ophthalmic solution Place 1 drop into the right eye at bedtime.      Marland Kitchen lisinopril (PRINIVIL,ZESTRIL) 40 MG tablet Take 40 mg by mouth daily.      . Multiple Vitamins-Minerals (OCUVITE PRESERVISION PO) Take 1 tablet by mouth daily.      . simvastatin (ZOCOR) 40 MG tablet Take 40 mg by mouth every evening.       . Skin Protectants, Misc. (BAZA PROTECT EX) Apply 1 application topically 3 (three) times daily.       No current facility-administered medications on file prior to visit.    Objective: There were no vitals taken for this visit. Exam: General: NAD, Thin HEENT: MMM, EOMI, PERRL Cardiovascular: RRR, no M/R/G Respiratory: CTAB, nml effort Abdomen: soft, non-tender Extremities: 2+ pulses, no edema Skin: warm and intact Neuro: CN grossly intact, strength 4/5 in upper extremities and grip strength, extension of LE 3/5  Labs and Imaging: CBC BMET  No results found for this basename: WBC, HGB, HCT, PLT,  in the last 168 hours No results found for this basename: NA, K, CL, CO2, BUN, CREATININE, GLUCOSE, CALCIUM,  in the last 168 hours     Ozella Rocks, MD 07/24/2013, 1:44 PM PGY-3, [facility] FPTS Intern pager: [phone], text pages welcome

## 2013-07-27 ENCOUNTER — Ambulatory Visit: Payer: Medicare Other | Admitting: Neurology

## 2013-07-29 ENCOUNTER — Non-Acute Institutional Stay: Payer: Medicare Other | Admitting: Family Medicine

## 2013-07-29 DIAGNOSIS — I2510 Atherosclerotic heart disease of native coronary artery without angina pectoris: Secondary | ICD-10-CM

## 2013-07-29 DIAGNOSIS — Z789 Other specified health status: Secondary | ICD-10-CM | POA: Insufficient documentation

## 2013-07-29 DIAGNOSIS — R269 Unspecified abnormalities of gait and mobility: Secondary | ICD-10-CM

## 2013-07-29 DIAGNOSIS — F0280 Dementia in other diseases classified elsewhere without behavioral disturbance: Secondary | ICD-10-CM

## 2013-07-29 DIAGNOSIS — Y9210 Unspecified residential institution as the place of occurrence of the external cause: Secondary | ICD-10-CM

## 2013-07-29 DIAGNOSIS — Z593 Problems related to living in residential institution: Secondary | ICD-10-CM | POA: Insufficient documentation

## 2013-07-29 DIAGNOSIS — F329 Major depressive disorder, single episode, unspecified: Secondary | ICD-10-CM

## 2013-07-29 NOTE — Assessment & Plan Note (Addendum)
Patient does not currently have chest pain. Is on ACE inhibitor and beta blocker. She is not currently on aspirin however is a poor candidate for anticoagulation due to fall risk. She is also currently on Bumex 1 mg 3 times a week. It is unclear from review of of EPIC record why she on this medication. She is not currently have lower extremity edema. Will wean to 0.5 mg 3 times a week.

## 2013-07-29 NOTE — Assessment & Plan Note (Signed)
Patient was admitted to [facility] nursing home on 07/24/2013 from [facility] assisted living due to inability to care for herself.

## 2013-07-29 NOTE — Assessment & Plan Note (Signed)
Patient to work with physical therapy at [facility]. Will check a vitamin D level and supplement as needed. Would consider repeat B12 and folate further evaluate for reversible causes of abnormal gait.

## 2013-07-29 NOTE — Assessment & Plan Note (Signed)
MMSE formed by 07/27/2013 was 13/30. Patient is not currently on Namenda or anticholinesterase inhibitor. Due to her severe dementia patient is a poor candidate for these medications at this time. We'll continue to monitor clinically.

## 2013-07-29 NOTE — Assessment & Plan Note (Signed)
Patient is currently on Celexa 20 mg daily. Due to significant dementia is difficult to tell how the patient is doing in terms of her depression. Would consider gently decreasing Celexa and monitor patient clinically.

## 2013-07-29 NOTE — Progress Notes (Signed)
Patient ID: DIVINITY KYLER, female   DOB: 1918/11/07, 79 y.o.   MRN: [id number] Family Medicine Teaching Service Nursing Home Admission Note  Patient name: [name] Medical record number: [id number] Date of birth: [birth year] Age: 79 y.o. Gender: female  Primary Care Provider: Uvaldo Rising, MD  Chief Complaint: Inability to perform ADLs. History of Present Illness: [name] is a 79 y.o. year old female admitted to [facility] nursing home on 07/24/2013 from [facility] assisted living. Patient has been having increasing difficulty with her activities of daily living. She has past medical history of dementia. Please see outlined past medical history below. Due to significant dementia patient is unable to provide significant past medical history. No family members are present today. Please see recent resident dictation by Dr. Kirby Funk for additional history of present illness.  Patient Active Problem List   Diagnosis Date Noted  . Nursing home resident 07/29/2013  . Altered mental status 05/13/2013  . Dementia with Lewy bodies 03/27/2013  . Other general symptoms(780.99) 10/30/2012  . Other vitamin B12 deficiency anemia 10/30/2012  . Abnormality of gait 10/30/2012  . 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 HYPERLIPIDEMIA 10/20/2009  . HYPERTENSION 10/20/2009  . CAD 10/20/2009  . CEREBROVASCULAR DISEASE 10/20/2009   Past Medical History: Past Medical History  Diagnosis Date  . Hypertension   . Dementia     Possible Lewy body dementia  . Hyperlipidemia   . Diabetes   . CAD (coronary artery disease)   . Degenerative arthritis   . Gait disorder   . Glaucoma   . Dementia with Lewy bodies 03/27/2013    Past Surgical History: Past Surgical History  Procedure Laterality Date  . Abdominal hysterectomy      for uterine fibroids  . Coronary artery bypass graft    . Total knee arthroplasty Left   . Cataract extraction,  bilateral      Social History: History   Social History  . Marital Status: Widowed    Spouse Name: N/A    Number of Children: N/A  . Years of Education: N/A   Social History Main Topics  . Smoking status: Never Smoker   . Smokeless tobacco: Never Used  . Alcohol Use: No  . Drug Use: No  . Sexual Activity: No   Other Topics Concern  . Not on file   Social History Narrative  . No narrative on file    Family History: Family History  Problem Relation Age of Onset  . Stroke Father   . Cancer Brother   . Heart failure Brother     Allergies: Allergies  Allergen Reactions  . Seroquel [Quetiapine Fumarate] Other (See Comments)    Unknown     Current Outpatient Prescriptions  Medication Sig Dispense Refill  . atenolol (TENORMIN) 100 MG tablet Take 50 mg by mouth daily.      . brimonidine-timolol (COMBIGAN) 0.2-0.5 % ophthalmic solution Place 1 drop into the right eye 2 (two) times daily. Wait 3-5 minutes between eye drops.      . bumetanide (BUMEX) 1 MG tablet Take 1 mg by mouth 3 (three) times a week. Monday, Wednesday, and Friday      . Cholecalciferol (VITAMIN D) 2000 UNITS CAPS Take 8,000 Units by mouth daily.      . citalopram (CELEXA) 20 MG tablet Take 20 mg by mouth 2 (two) times daily.      Marland Kitchen docusate sodium (COLACE) 100 MG capsule Take  100 mg by mouth daily as needed for constipation.      . hydrOXYzine (ATARAX/VISTARIL) 25 MG tablet Take 25 mg by mouth 4 (four) times daily - after meals and at bedtime.      . hydrOXYzine (ATARAX/VISTARIL) 50 MG tablet Take 50 mg by mouth at bedtime as needed (sleep).      Marland Kitchen ketoconazole (NIZORAL) 2 % cream Apply 1 application topically daily. Apply to red area of abdomen for 14 days. Started at 06-01-13.      Marland Kitchen latanoprost (XALATAN) 0.005 % ophthalmic solution Place 1 drop into the right eye at bedtime.      Marland Kitchen lisinopril (PRINIVIL,ZESTRIL) 40 MG tablet Take 40 mg by mouth daily.      Marland Kitchen LORazepam (ATIVAN) 0.5 MG tablet Take 0.5-1  mg by mouth at bedtime.      Marland Kitchen LORazepam (ATIVAN) 0.5 MG tablet Take 0.5-1 mg by mouth every 8 (eight) hours as needed for anxiety.      . Multiple Vitamins-Minerals (OCUVITE PRESERVISION PO) Take 1 tablet by mouth daily.      . simvastatin (ZOCOR) 40 MG tablet Take 40 mg by mouth every evening.       . Skin Protectants, Misc. (BAZA PROTECT EX) Apply 1 application topically 3 (three) times daily.       No current facility-administered medications for this visit.   Review Of Systems: Unable to complete review of systems due to patient's dementia. On review of chart no recent fevers or chills, tolerating diet, no chest pain, no shortness of breath, no abdominal pain, no emesis, no nausea, normal bowel movements, no extremity edema, no behavioral issues  Physical Exam: Pulse: 58  Blood Pressure: 146/73 RR: 20   Temp: 97.0  General: alert, cooperative, no distress and slowed mentation, sitting in wheelchair HEENT: PERRLA, extra ocular movement intact, sclera clear, anicteric, oropharynx clear, no lesions, neck supple with midline trachea, thyroid without masses and trachea midline Heart: S1, S2 normal, no murmur, rub or gallop, regular rate and rhythm Lungs: clear to auscultation, no wheezes or rales and unlabored breathing Abdomen: abdomen is soft without significant tenderness, masses, organomegaly or guarding Extremities: extremities normal, atraumatic, no cyanosis or edema Skin:no rashes, no wounds Neurology: alert to self, unable to answer the date or where at today, moving all extremities Labs and Imaging: Lab Results  Component Value Date/Time   NA 139 05/14/2013  6:04 AM   K 3.9 05/14/2013  6:04 AM   CL 106 05/14/2013  6:04 AM   CO2 26 05/14/2013  6:04 AM   BUN 12 05/14/2013  6:04 AM   CREATININE 0.86 05/14/2013  6:04 AM   GLUCOSE 71 05/14/2013  6:04 AM   Lab Results  Component Value Date   WBC 7.0 05/14/2013   HGB 10.3* 05/14/2013   HCT 29.3* 05/14/2013   MCV 93.3 05/14/2013   PLT 206  05/14/2013      Assessment and Plan: [name] is a 79 y.o. year old female admitted to [facility] for inability to perform activities of daily living. Please see problem specific assessment and plan. Please see resident note for additional assessment and plan.

## 2013-08-03 ENCOUNTER — Other Ambulatory Visit: Payer: Self-pay | Admitting: Family Medicine

## 2013-08-03 MED ORDER — LORAZEPAM 0.5 MG PO TABS: ORAL_TABLET | ORAL | Status: DC

## 2013-08-03 NOTE — Telephone Encounter (Signed)
Refilled med in response to faxed request from [facility]

## 2013-08-08 ENCOUNTER — Telehealth: Payer: Self-pay | Admitting: Family Medicine

## 2013-08-08 NOTE — Telephone Encounter (Signed)
Received call from [facility] at [facility]. Pt was found sitting on the ground a few feet away from her bed complaining of pain in her buttocks/tailbone. Pt stated she was standing up to come see "her" Joni Reining, one of the nursing staff) and now complains only of some tenderness but cannot localize it. Pt has no sign of other injury and no changes on neuro checks. Ordered to continue neuro checks and to bring pt to the ED if any deficits are noted or if pain increases or does not improve on its own. Will route this to Drs. Candida Peeling, and Engineer, materials (current geriatrics resident).  Stephanie Coup Street, MD 08/08/2013, 2:53 AM

## 2013-08-11 ENCOUNTER — Encounter: Payer: Self-pay | Admitting: Neurology

## 2013-08-11 ENCOUNTER — Ambulatory Visit (INDEPENDENT_AMBULATORY_CARE_PROVIDER_SITE_OTHER): Payer: Medicare Other | Admitting: Neurology

## 2013-08-11 ENCOUNTER — Ambulatory Visit: Payer: Self-pay | Admitting: Neurology

## 2013-08-11 ENCOUNTER — Encounter: Payer: Self-pay | Admitting: Pharmacist

## 2013-08-11 VITALS — BP 140/51 | HR 61

## 2013-08-11 DIAGNOSIS — F0280 Dementia in other diseases classified elsewhere without behavioral disturbance: Secondary | ICD-10-CM

## 2013-08-11 NOTE — Progress Notes (Signed)
Reason for visit: Dementia  [name] is an 79 y.o. female  History of present illness:  [name] is a 79 year old right-handed black female with a history of a progressive memory disorder, and a history of a gait disorder. The patient currently is in an extended care facility. She remains a fall risk, and she will commonly try to get out of bed at night, and she is unable to walk independently. The patient has a tendency to lean backwards. The patient generally will nap during the day, and stay awake during the nighttime. The patient will try to get up several times out of bed at night. The patient has a bed alarm, but oftentimes she will fall before her caretakers can get to her. The patient has not sustained significant injury from falls fortunately. The patient has trazodone at night as needed for sleep. The patient may go several days or nights without sleep. The patient is eating well, with no reports of hallucinations. The patient returns for an evaluation.  Past Medical History  Diagnosis Date  . Hypertension   . Dementia     Possible Lewy body dementia  . Hyperlipidemia   . Diabetes   . CAD (coronary artery disease)   . Degenerative arthritis   . Gait disorder   . Glaucoma   . Dementia with Lewy bodies 03/27/2013    Past Surgical History  Procedure Laterality Date  . Abdominal hysterectomy      for uterine fibroids  . Coronary artery bypass graft    . Total knee arthroplasty Left   . Cataract extraction, bilateral      Family History  Problem Relation Age of Onset  . Stroke Father   . Cancer Brother   . Heart failure Brother     Social history:  reports that she has never smoked. She has never used smokeless tobacco. She reports that she does not drink alcohol or use illicit drugs.    Allergies  Allergen Reactions  . Seroquel [Quetiapine Fumarate] Other (See Comments)    Unknown     Medications:  Current Outpatient Prescriptions on File Prior to Visit   Medication Sig Dispense Refill  . atenolol (TENORMIN) 100 MG tablet Take 50 mg by mouth daily.      . brimonidine-timolol (COMBIGAN) 0.2-0.5 % ophthalmic solution Place 1 drop into the right eye 2 (two) times daily. Wait 3-5 minutes between eye drops.      . bumetanide (BUMEX) 1 MG tablet Take 0.5 mg by mouth 3 (three) times a week. Monday, Wednesday, and Friday      . Cholecalciferol (VITAMIN D) 2000 UNITS CAPS Take 8,000 Units by mouth daily.      . citalopram (CELEXA) 20 MG tablet Take 20 mg by mouth 2 (two) times daily.      Marland Kitchen docusate sodium (COLACE) 100 MG capsule Take 100 mg by mouth daily as needed for constipation.      Marland Kitchen latanoprost (XALATAN) 0.005 % ophthalmic solution Place 1 drop into the right eye at bedtime.      Marland Kitchen lisinopril (PRINIVIL,ZESTRIL) 40 MG tablet Take 40 mg by mouth daily.      . Multiple Vitamins-Minerals (OCUVITE PRESERVISION PO) Take 1 tablet by mouth daily.      . simvastatin (ZOCOR) 40 MG tablet Take 40 mg by mouth every evening.       . Skin Protectants, Misc. (BAZA PROTECT EX) Apply 1 application topically 3 (three) times daily.      Marland Kitchen  traZODone (DESYREL) 50 MG tablet Take 50 mg by mouth at bedtime.        No current facility-administered medications on file prior to visit.    ROS:  Out of a complete 14 system review of symptoms, the patient complains only of the following symptoms, and all other reviewed systems are negative.  Incontinence of bladder Runny nose Confusion Gait disorder Anxiety, insomnia, disinterest in activities  Blood pressure 140/51, pulse 61, weight 0 lb (0 kg).  Physical Exam  General: The patient is alert and cooperative at the time of the examination.  Skin: No significant peripheral edema is noted.   Neurologic Exam  Mental status: Mini-Mental status examination done today shows a total score of 10/30.  Cranial nerves: Facial symmetry is present. Speech is normal, no aphasia or dysarthria is noted. Extraocular  movements are full. Visual fields are full.  Motor: The patient has good strength in all 4 extremities.  Coordination: The patient has good finger-nose-finger and heel-to-shin bilaterally. Mild apraxia is noted with the use of the extremities.  Gait and station: The patient requires assistance with standing. Once up, the patient is unable to remain standing without assistance, she can walk short distances with assistance, slightly wide-based gait. Romberg is positive, the patient falls backwards. Tandem gait was not attempted.  Reflexes: Deep tendon reflexes are symmetric.   Assessment/Plan:  1. Progressive dementia, possible Lewy body dementia  2. Gait disorder  The patient is currently nonambulatory, and remains a high fall risk secondary to her dementia. The patient does not sleep well at night, and I would recommend changing the trazodone to a scheduled basis at night, and increasing the dose if needed. Previously, Seroquel resulted in severe sedation. The patient is off of Seroquel at this point. The patient will followup in 4-6 months.  Marlan Palau MD 08/11/2013 7:42 PM  [facility] [address] [place], [place] [address]  Phone [phone] Fax [phone]

## 2013-08-11 NOTE — Patient Instructions (Addendum)
Change the Trazodone dose to 50 mg at night on a scheduled basis.

## 2013-08-12 ENCOUNTER — Other Ambulatory Visit: Payer: Self-pay | Admitting: Family Medicine

## 2013-08-12 MED ORDER — LORAZEPAM 0.5 MG PO TABS: 0.5000 mg | ORAL_TABLET | Freq: Four times a day (QID) | ORAL | Status: DC | PRN

## 2013-08-26 ENCOUNTER — Non-Acute Institutional Stay: Payer: Medicare Other | Admitting: Family Medicine

## 2013-08-26 DIAGNOSIS — F0280 Dementia in other diseases classified elsewhere without behavioral disturbance: Secondary | ICD-10-CM

## 2013-08-26 DIAGNOSIS — I10 Essential (primary) hypertension: Secondary | ICD-10-CM

## 2013-08-26 DIAGNOSIS — R635 Abnormal weight gain: Secondary | ICD-10-CM

## 2013-08-26 NOTE — Assessment & Plan Note (Signed)
A: remains demented. Mood is stable. P: Continue current regimen. Will f/u on ativan and wean to off as tolerated.

## 2013-08-26 NOTE — Assessment & Plan Note (Signed)
A: gaining weight. No evidence of fluid overload. Appears nutritional and weight is trending towards baseline. No intervention necessary at this time.

## 2013-08-26 NOTE — Progress Notes (Signed)
  Subjective:    Patient ID: [name], female    DOB: [birth year], 79 y.o.   MRN: [id number]  HPI 79 year old female nursing home patient seen for routine followup. Of note her weight is up since admission nearly 5 pounds. Patient has no complaints today. She reports that she's always had a small appetite has not changed. Documented weights up to 141 on 11/01/2010 in Epic. Review of Systems  Constitutional: Positive for unexpected weight change. Negative for appetite change.  Respiratory: Negative for shortness of breath.   Cardiovascular: Negative for chest pain, palpitations and leg swelling.  Gastrointestinal: Negative for nausea, vomiting and abdominal pain.  Genitourinary: Negative for dysuria.      Objective:   Physical Exam BP 143/59  Pulse 60  Temp(Src) 97.2 F (36.2 C)  Resp 18  Wt 123 lb 9.6 oz (56.065 kg) Wt Readings from Last 3 Encounters:  08/25/13 123 lb 9.6 oz (56.065 kg)  10/30/12 132 lb (59.875 kg)  11/01/10 141 lb (63.957 kg)  General appearance: alert, cooperative and no distress, sitting in her wheelchair in the hall Lungs: clear to auscultation bilaterally Heart: regular rate and rhythm, S1, S2 normal, no murmur, click, rub or gallop Abdomen: soft, non-tender; bowel sounds normal; no masses,  no organomegaly Extremities: extremities normal, atraumatic, no cyanosis or edema Neurologic: alert, oriented to person only. No place or time. No rigidity. Her     Assessment & Plan:

## 2013-08-26 NOTE — Assessment & Plan Note (Signed)
A: BP well controlled. No peripheral edema.  P: Continue current medication regimen.

## 2013-09-06 ENCOUNTER — Telehealth: Payer: Self-pay | Admitting: Family Medicine

## 2013-09-06 NOTE — Telephone Encounter (Signed)
After Hours Line:  Nurse called today to inform that pt has been agitated during the day today and worse tonight. She gave her ativan 0.5 mg PO as indicated as well as Trazodone 3 hours ago and pt continues to wander around the institution and no following verbal redirecting techniques. Nurse reports that last Friday she was agitated as well and received an extra dose of Ativan 0.5 IM and this seemed to help. Ativan 0.5mg  IM X1 ordered verbally. F/u tomorrow by team is recommended in order to adjust medication regimen.   D. Piloto Rolene Arbour, MD Family Medicine  PGY-3

## 2013-09-18 ENCOUNTER — Telehealth: Payer: Self-pay | Admitting: Family Medicine

## 2013-09-18 NOTE — Telephone Encounter (Signed)
[facility] Emergency Line  Received call from nurse at [facility] about patient. She has had 2nd episode of refusing PO medications in the last 1.5 weeks. She is refusing trazodone and ativan QHS. Per Rinaldo Cloud, she is behaving wildly, walking into patient's rooms, and is difficult to control. She has not had recent fevers, is not seeming to be in pain, and has no other reported concerns. She is breathing okay on room air. Vitals at around 3PM today were  T 97.7 HR 68 RR 16 BP 130/68 O2 sat: currently attempted and refuses.  Assessment:  Likely some sundowning with pt's baseline Lewy Body Dementia. Also likely related to refusing QHS medications. Unlikely infection with no fever and no other complaints.  Plan:  - It seems this is occurring frequently lately. Stable at this time, but will fwd to [facility] team and PCP to evaluate for secondary causes. - More likely, this is related to her dx of Lewy Body Dementia. - Verbal order for IM ativan 0.5mg  x 1 now to help with wandering and non-redirectable behavior. - Nursing home requesting a PRN order for if similar med refusal continues to occur. Will defer to PCP. - FWDing to [facility] team and PCP.   Leona Singleton, MD  09/18/2013 9:17 PM Family Practice PGY-2

## 2013-09-22 ENCOUNTER — Other Ambulatory Visit: Payer: Self-pay | Admitting: Family Medicine

## 2013-09-22 MED ORDER — LORAZEPAM 2 MG/ML IJ SOLN: 0.2500 mg | Freq: Three times a day (TID) | INTRAMUSCULAR | Status: DC

## 2013-09-22 NOTE — Telephone Encounter (Signed)
Refilled med in response to faxed request [facility] in rx for IM lorazepam.

## 2013-09-23 ENCOUNTER — Non-Acute Institutional Stay: Payer: Medicare Other | Admitting: Family Medicine

## 2013-09-23 ENCOUNTER — Encounter: Payer: Self-pay | Admitting: Family Medicine

## 2013-09-23 DIAGNOSIS — F0280 Dementia in other diseases classified elsewhere without behavioral disturbance: Secondary | ICD-10-CM

## 2013-09-23 DIAGNOSIS — F3289 Other specified depressive episodes: Secondary | ICD-10-CM

## 2013-09-23 DIAGNOSIS — R635 Abnormal weight gain: Secondary | ICD-10-CM

## 2013-09-23 DIAGNOSIS — F329 Major depressive disorder, single episode, unspecified: Secondary | ICD-10-CM

## 2013-09-23 MED ORDER — CITALOPRAM HYDROBROMIDE 20 MG PO TABS: 20.0000 mg | ORAL_TABLET | Freq: Every day | ORAL | Status: DC

## 2013-09-23 MED ORDER — TRAZODONE HCL 100 MG PO TABS: 100.0000 mg | ORAL_TABLET | Freq: Every day | ORAL | Status: DC

## 2013-09-23 MED ORDER — QUETIAPINE FUMARATE 25 MG PO TABS: 25.0000 mg | ORAL_TABLET | Freq: Every day | ORAL | Status: DC

## 2013-09-23 NOTE — Assessment & Plan Note (Signed)
Weight stable from last month. Will monitor and resolve problem.

## 2013-09-23 NOTE — Progress Notes (Signed)
Attending Progress Note  Subjective:    Patient ID: [name], female    DOB: Mar 12, 1918, 79 y.o.   MRN: [id number]  HPI 79 yo F NH patient seen for routine f/u.  Complaints: R knee pain following fall last week. Does not recall how fall happened reports that she was not standing.   Concerns: staff reports patient does not sleep well. Is agitated at night. Refusing po prn ativan. Required IM ativan x one. Patient reports sleeping well. Patient found on 300 hall this AM.   Review of Systems As per HPI  Otherwise negative.     Objective: BP 158/69  Pulse 78  Temp(Src) 97.3 F (36.3 C)  Resp 20  Wt 123 lb 9.6 oz (56.065 kg) Wt Readings from Last 3 Encounters:  09/23/13 123 lb 9.6 oz (56.065 kg)  08/25/13 123 lb 9.6 oz (56.065 kg)  10/30/12 132 lb (59.875 kg)       Physical Exam  Constitutional: She appears well-developed and well-nourished. No distress.  HENT:  Head: Normocephalic and atraumatic.  Dry tongue   Neck: Normal range of motion. Neck supple.  Cardiovascular: Normal rate, regular rhythm and normal heart sounds.   Pulmonary/Chest: Effort normal and breath sounds normal. No respiratory distress. She has no wheezes. She has no rales. She exhibits no tenderness.  Musculoskeletal: She exhibits no edema.  Neurological: She is alert.  Oriented to person (except age and DOB), month (Nov), season (fall).  Not oriented to time (1925) or place.       Assessment & Plan:

## 2013-09-23 NOTE — Assessment & Plan Note (Signed)
A: stable. Sundowning at night. P: D/c PM celexa Add PM seroquel back on 25 mg q HS at 8 PM  Keep trazodone 100 mg PO q HS at 9 PM  D/C PO and IM ativan

## 2013-09-23 NOTE — Assessment & Plan Note (Signed)
Changed celexa to 20 mg in the AM only to hopefully improve sleep.

## 2013-10-12 ENCOUNTER — Encounter: Payer: Self-pay | Admitting: Pharmacist

## 2013-10-16 ENCOUNTER — Non-Acute Institutional Stay: Payer: Medicare Other | Admitting: Family Medicine

## 2013-10-16 DIAGNOSIS — F0280 Dementia in other diseases classified elsewhere without behavioral disturbance: Secondary | ICD-10-CM

## 2013-10-16 DIAGNOSIS — I10 Essential (primary) hypertension: Secondary | ICD-10-CM

## 2013-10-16 NOTE — Assessment & Plan Note (Signed)
Patient unable to appropriately answer most questions today. This appears to be a decline in her dementia based on review of previous notes. Given that this patient is new to me, I will discuss with the geriatrics team further to see if this is a decline.

## 2013-10-16 NOTE — Assessment & Plan Note (Signed)
BP slightly elevated from previous values, though given patients age I would allow some level of elevation. Would continue current therapy for HTN.

## 2013-10-16 NOTE — Progress Notes (Signed)
Patient ID: [name], female   DOB: [birth year], 79 y.o.   MRN: [id number]  Marikay Alar, MD Phone: [phone]  [name] is a 79 y.o. female who is evaluated today for routine f/u.  Patient encounter limited by patient inability to appropriately answer questions. Throughout the encounter the patient talked disjointedly about nothing in particular. When asked if she had any complaints she stated yes, but would not give any particular complaints when asked for specifics, then would say she didn't have any complaints. She did not answer any direct questions regarding her well being.  Staff notes she often wanders around [place] and initially the patient was difficult to find. She was eventually found in the dining room.  Past Medical History  Diagnosis Date  . Hypertension   . Dementia     Possible Lewy body dementia  . Hyperlipidemia   . Diabetes   . CAD (coronary artery disease)   . Degenerative arthritis   . Gait disorder   . Glaucoma   . Dementia with Lewy bodies 03/27/2013    History  Smoking status  . Never Smoker   Smokeless tobacco  . Never Used    Family History  Problem Relation Age of Onset  . Stroke Father   . Cancer Brother   . Heart failure Brother     Current Outpatient Prescriptions on File Prior to Visit  Medication Sig Dispense Refill  . acetaminophen (TYLENOL) 325 MG tablet Take 650 mg by mouth every 4 (four) hours as needed for pain.      Marland Kitchen aspirin 81 MG tablet Take 81 mg by mouth daily.      Marland Kitchen atenolol (TENORMIN) 100 MG tablet Take 50 mg by mouth daily.      . brimonidine-timolol (COMBIGAN) 0.2-0.5 % ophthalmic solution Place 1 drop into the right eye 2 (two) times daily. Wait 3-5 minutes between eye drops.      . bumetanide (BUMEX) 1 MG tablet Take 0.5 mg by mouth 3 (three) times a week. Monday, Wednesday, and Friday      . Cholecalciferol (VITAMIN D) 2000 UNITS CAPS Take 8,000 Units by mouth daily.      . citalopram (CELEXA) 20 MG tablet  Take 1 tablet (20 mg total) by mouth daily.      Marland Kitchen latanoprost (XALATAN) 0.005 % ophthalmic solution Place 1 drop into the right eye at bedtime.      Marland Kitchen lisinopril (PRINIVIL,ZESTRIL) 40 MG tablet Take 40 mg by mouth daily.      . Multiple Vitamins-Minerals (OCUVITE PRESERVISION PO) Take 1 tablet by mouth daily.      . QUEtiapine (SEROQUEL) 25 MG tablet Take 1 tablet (25 mg total) by mouth at bedtime.      . simvastatin (ZOCOR) 40 MG tablet Take 40 mg by mouth every evening.       . Skin Protectants, Misc. (BAZA PROTECT EX) Apply 1 application topically 3 (three) times daily.      . traZODone (DESYREL) 100 MG tablet Take 1 tablet (100 mg total) by mouth at bedtime.       No current facility-administered medications on file prior to visit.    ROS: Per HPI   Physical Exam Filed Vitals:   10/16/13 1501  BP: 167/67  Pulse: 58  Temp: 97.4 F (36.3 C)  Resp: 27    Physical Examination: General appearance - alert, well appearing, and in no distress Mental status - oriented only to person, not place or time Chest -  clear to auscultation, no wheezes, rales or rhonchi, symmetric air entry Heart - normal rate, regular rhythm, normal S1, S2, no murmurs, rubs, clicks or gallops Extremities - no pedal edema noted    Assessment/Plan: Please see individual problem list.

## 2013-10-20 ENCOUNTER — Other Ambulatory Visit: Payer: Self-pay | Admitting: Family Medicine

## 2013-11-23 ENCOUNTER — Non-Acute Institutional Stay: Payer: Medicare Other | Admitting: Family Medicine

## 2013-11-23 ENCOUNTER — Telehealth: Payer: Self-pay | Admitting: Family Medicine

## 2013-11-23 DIAGNOSIS — R509 Fever, unspecified: Secondary | ICD-10-CM

## 2013-11-23 DIAGNOSIS — R0902 Hypoxemia: Secondary | ICD-10-CM | POA: Insufficient documentation

## 2013-11-23 NOTE — Assessment & Plan Note (Signed)
Fever of uncertain origin. Suspect respiratory cause given physical exam findings. -Will check chest x-ray, blood cultures, and urine cultures. No evidence of skin infection -Empiric treatment with Rocephin 1 g

## 2013-11-23 NOTE — Assessment & Plan Note (Signed)
Patient developed acute hypoxia. Improved with oxygen per nasal cannula. In the setting of fever there is concern for possible pneumonia given physical exam findings. -Stat chest x-ray ordered, urine and blood cultures ordered, 1 g of Rocephin to be given after cultures have been obtained -Continue oxygen per nasal cannula to maintain SpO2 greater than 92%, scheduled nebulizers

## 2013-11-23 NOTE — Telephone Encounter (Signed)
Nurse call to say that stat X-ray read did not find any acute cardiopulmonary disease. Pt given CTX 1 g and now afebrile. Will f/u urine and blood cx results. Reassess 11/24/12.

## 2013-11-23 NOTE — Telephone Encounter (Signed)
Pt's nurse called to let me know that she had a fever of 103.1 F this AM. At the time her oxygen saturation was 73%, so she was placed on oyxgen via nasal cannula on 3L. Her O2 saturation is currently 96%, RR 24, and BP 146/75. According to the nurse her lung exam is noteworthy for crackles bilaterally. I ordered stat CXR, blood culture, urine culture and gram stain. Nurse acknowledged these orders. Will follow up after chest X-ray.

## 2013-11-23 NOTE — Progress Notes (Signed)
   Subjective:    Patient ID: [name], female    DOB: 05/27/1918, 80 y.o.   MRN: [id number]  HPI 80 year old female with past medical history of hypertension, severe dementia, CAD, currently living in nursing home seen and evaluated for acute hypoxia and fever. Patient is currently on 3 L nasal cannula. Due to severe dementia is difficult to obtain history of present illness. Per nursing staff patient developed acute hypoxia this morning, the on-call physician was contacted and stat chest x-ray, urine culture, and blood cultures were ordered. Patient was placed on 3 L nasal cannula and given breathing treatments. She started to show some improvement. Patient reports no current pain.  Reviewed social history.   Review of Systems Unable to obtain due to severe dementia.    Objective:   Physical Exam Vitals: Reviewed, SpO2 up to 96% on 3 L nasal cannula General: African American female lying in bed, she is not oriented, she is arousable and follows commands HEENT: Extraocular motion intact, no scleral icterus, dry mucous members, neck was supple, no anterior posterior cervical lymphadenopathy Cardiac: Regular rate and rhythm, S1 and S2 present, no murmurs, no heaves or thrills Respiratory: Coarse rhonchi in bilateral air fields, air flow was present in all lung fields, normal effort Abdomen: Soft, nontender, bowel sounds present, no rebound, no guarding Extremities: No edema, 2+ radial pulses bilaterally Skin: No sacral decubitus ulcer noted, no obvious source of cellulitis or skin breakdown        Assessment & Plan:  Please see problem specific assessment and plan.

## 2013-12-07 ENCOUNTER — Encounter: Payer: Self-pay | Admitting: Pharmacist

## 2013-12-07 ENCOUNTER — Non-Acute Institutional Stay: Payer: Medicare Other | Admitting: Family Medicine

## 2013-12-07 DIAGNOSIS — F3289 Other specified depressive episodes: Secondary | ICD-10-CM

## 2013-12-07 DIAGNOSIS — R269 Unspecified abnormalities of gait and mobility: Secondary | ICD-10-CM

## 2013-12-07 DIAGNOSIS — R509 Fever, unspecified: Secondary | ICD-10-CM

## 2013-12-07 DIAGNOSIS — I10 Essential (primary) hypertension: Secondary | ICD-10-CM

## 2013-12-07 DIAGNOSIS — F329 Major depressive disorder, single episode, unspecified: Secondary | ICD-10-CM

## 2013-12-07 DIAGNOSIS — H409 Unspecified glaucoma: Secondary | ICD-10-CM

## 2013-12-07 DIAGNOSIS — F32A Depression, unspecified: Secondary | ICD-10-CM

## 2013-12-08 ENCOUNTER — Encounter: Payer: Self-pay | Admitting: Family Medicine

## 2013-12-08 NOTE — Progress Notes (Signed)
  Attending Progress Note  Subjective:    Patient ID: Lacey LassoRosie W Feeback, female    DOB: 10/17/1918, 80 y.o.   MRN: [id number]  HPI 80 yo F seen for routine visit;  Acute events: fall on 12/04/13. No injury. No details of fall available from staff.  11/23/13: cough, sneezing and fever. Since resolved. Negative UCx. Her roommate now with similar symptoms and being treated with tamiflu. Patient afebrile w.o cough.   Patient does not recall fall. She denies HA, CP, SOB, dizziness. She admits to "R knee trouble" at times.   Review of Systems As per HPI     Objective:   Physical Exam BP 143/65  Pulse 74  Temp(Src) 97.4 F (36.3 C)  Resp 18  SpO2 100% BP Readings from Last 3 Encounters:  12/04/13 143/65  11/23/13 145/73  10/16/13 167/67  General appearance: alert, cooperative and no distress Head: Normocephalic, without obvious abnormality, atraumatic Lungs: clear to auscultation bilaterally Heart: regular rate and rhythm, S1, S2 normal, no murmur, click, rub or gallop Abdomen: soft, non-tender; bowel sounds normal; no masses,  no organomegaly Extremities: no edema.  R knee: lacks 10 degrees of extension. Pain upon attempt to extend. No effusion. No erythema.      Assessment & Plan:

## 2013-12-09 ENCOUNTER — Encounter: Payer: Self-pay | Admitting: Pharmacist

## 2013-12-09 DIAGNOSIS — H409 Unspecified glaucoma: Secondary | ICD-10-CM | POA: Insufficient documentation

## 2013-12-09 NOTE — Assessment & Plan Note (Signed)
A: suspect viral etiology, possible influenza given roommate sick. Doing well now. P: resolve problem

## 2013-12-09 NOTE — Assessment & Plan Note (Signed)
A: no evidence of CHF upon chart review. No edema on exam. BP wnl. P: D/cd bumex F/u weight and BP Continue BB and ARB given CAD hx, wean doses if needed.

## 2013-12-09 NOTE — Assessment & Plan Note (Signed)
A: alert and friendly affect. Seems stable.  P: continue celexa 20 mg daily

## 2013-12-09 NOTE — Assessment & Plan Note (Signed)
Patient is wheelchair dependent. Able to get along well in wheelchair.

## 2013-12-16 ENCOUNTER — Non-Acute Institutional Stay (INDEPENDENT_AMBULATORY_CARE_PROVIDER_SITE_OTHER): Payer: Medicare Other | Admitting: Family Medicine

## 2013-12-16 DIAGNOSIS — R269 Unspecified abnormalities of gait and mobility: Secondary | ICD-10-CM

## 2013-12-16 DIAGNOSIS — I10 Essential (primary) hypertension: Secondary | ICD-10-CM

## 2013-12-16 DIAGNOSIS — G3183 Dementia with Lewy bodies: Secondary | ICD-10-CM

## 2013-12-16 DIAGNOSIS — F3289 Other specified depressive episodes: Secondary | ICD-10-CM

## 2013-12-16 DIAGNOSIS — F32A Depression, unspecified: Secondary | ICD-10-CM

## 2013-12-16 DIAGNOSIS — F329 Major depressive disorder, single episode, unspecified: Secondary | ICD-10-CM

## 2013-12-16 DIAGNOSIS — F0280 Dementia in other diseases classified elsewhere without behavioral disturbance: Secondary | ICD-10-CM

## 2013-12-16 NOTE — Progress Notes (Signed)
Patient ID: [name], female   DOB: 10/01/1918, 80 y.o.   MRN: [id number]  Lacey AlarEric Koltin Wehmeyer, MD Phone: [phone]  Daiva EvesRosie W Etheleen Mccarthy is a 80 y.o. female was seen today as a nursing home visit.  Patient seen after eating breakfast and reports that she enjoyed her breakfast this morning. Patient denies any complaints. No reported acute events since her last evaluation on 12/07/13. She denies CP and SOB.   Past Medical History  Diagnosis Date  . Hypertension   . Dementia     Possible Lewy body dementia  . Hyperlipidemia   . Diabetes   . CAD (coronary artery disease)   . Degenerative arthritis   . Gait disorder   . Glaucoma   . Dementia with Lewy bodies 03/27/2013    History  Smoking status  . Never Smoker   Smokeless tobacco  . Never Used    Family History  Problem Relation Age of Onset  . Stroke Father   . Cancer Brother   . Heart failure Brother     Current Outpatient Prescriptions on File Prior to Visit  Medication Sig Dispense Refill  . acetaminophen (TYLENOL) 325 MG tablet Take 650 mg by mouth every 4 (four) hours as needed for pain.      Marland Kitchen. aspirin 81 MG tablet Take 81 mg by mouth daily.      Marland Kitchen. atenolol (TENORMIN) 100 MG tablet Take 50 mg by mouth daily.      . brimonidine-timolol (COMBIGAN) 0.2-0.5 % ophthalmic solution Place 1 drop into the right eye 2 (two) times daily. Wait 3-5 minutes between eye drops.      . citalopram (CELEXA) 20 MG tablet Take 1 tablet (20 mg total) by mouth daily.      Marland Kitchen. docusate sodium (COLACE) 100 MG capsule Take 100 mg by mouth daily. as needed for constipation      . latanoprost (XALATAN) 0.005 % ophthalmic solution Place 1 drop into the right eye at bedtime.      Marland Kitchen. lisinopril (PRINIVIL,ZESTRIL) 40 MG tablet Take 40 mg by mouth daily.      . Multiple Vitamins-Minerals (OCUVITE PRESERVISION PO) Take 1 tablet by mouth daily.      . QUEtiapine (SEROQUEL) 25 MG tablet Take 2 tablets (50mg  total) by mouth at bedtime.      . simvastatin (ZOCOR)  40 MG tablet Take 40 mg by mouth every evening.       . Skin Protectants, Misc. (BAZA PROTECT EX) Apply 1 application topically 3 (three) times daily.      . Vitamin D, Ergocalciferol, (DRISDOL) 50000 UNITS CAPS capsule Take 50,000 Units by mouth every 30 (thirty) days.       No current facility-administered medications on file prior to visit.    ROS: Per HPI   Physical Exam Filed Vitals:   12/16/13 0913  BP: 112/68  Pulse: 61  Temp: 97.9 F (36.6 C)    Physical Examination: General appearance - alert, well appearing, and in no distress Mental status - answered all questions appropriately Chest - clear to auscultation, no wheezes, rales or rhonchi, symmetric air entry Heart - normal rate, regular rhythm, normal S1, S2, no murmurs, rubs, clicks or gallops Extremities - no pedal edema noted   Assessment/Plan: Please see individual problem list.

## 2013-12-16 NOTE — Assessment & Plan Note (Addendum)
Patient much more appropriate today compared to my last encounter with her. Will continue to monitor.

## 2013-12-16 NOTE — Assessment & Plan Note (Signed)
Appears stable on current celexa dosing. Will continue this medications.

## 2013-12-16 NOTE — Assessment & Plan Note (Signed)
Patient stable in wheel chair. To continue use of this equipment.

## 2013-12-16 NOTE — Assessment & Plan Note (Signed)
Patient at goal. May be able to back off lisinopril from 40 to 20 if patient has BPs consistently at the level of today's visit. Will continue current dosing of ACE and BB at this time with weaning of doses to occur if continues to be well controlled.

## 2013-12-17 ENCOUNTER — Other Ambulatory Visit: Payer: Self-pay | Admitting: Family Medicine

## 2013-12-17 MED ORDER — LISINOPRIL 40 MG PO TABS: 20.0000 mg | ORAL_TABLET | Freq: Every day | ORAL | Status: DC

## 2014-01-06 ENCOUNTER — Encounter (HOSPITAL_COMMUNITY): Payer: Self-pay | Admitting: Pharmacist

## 2014-01-19 ENCOUNTER — Encounter: Payer: Self-pay | Admitting: Sports Medicine

## 2014-01-19 LAB — VITAMIN D 25 HYDROXY (VIT D DEFICIENCY, FRACTURES): VIT D 25 HYDROXY: 77 ng/mL (ref 30–89)

## 2014-02-03 ENCOUNTER — Non-Acute Institutional Stay: Payer: Medicare Other | Admitting: Family Medicine

## 2014-02-03 DIAGNOSIS — I10 Essential (primary) hypertension: Secondary | ICD-10-CM

## 2014-02-04 MED ORDER — AMLODIPINE BESYLATE 5 MG PO TABS: 5.0000 mg | ORAL_TABLET | Freq: Every day | ORAL | Status: DC

## 2014-02-08 ENCOUNTER — Encounter (INDEPENDENT_AMBULATORY_CARE_PROVIDER_SITE_OTHER): Payer: Self-pay

## 2014-02-08 ENCOUNTER — Ambulatory Visit (INDEPENDENT_AMBULATORY_CARE_PROVIDER_SITE_OTHER): Payer: Medicare Other | Admitting: Nurse Practitioner

## 2014-02-08 ENCOUNTER — Encounter: Payer: Self-pay | Admitting: Nurse Practitioner

## 2014-02-08 VITALS — BP 112/63 | HR 56

## 2014-02-08 DIAGNOSIS — R269 Unspecified abnormalities of gait and mobility: Secondary | ICD-10-CM

## 2014-02-08 DIAGNOSIS — G3183 Dementia with Lewy bodies: Principal | ICD-10-CM

## 2014-02-08 DIAGNOSIS — F0280 Dementia in other diseases classified elsewhere without behavioral disturbance: Secondary | ICD-10-CM

## 2014-02-08 NOTE — Assessment & Plan Note (Addendum)
A: Elevated SBP x 2 reading over baseline of 124-154/60-72. No s/s of acute end organ damage. Stable dementia.  P: Start norvasc 5 mg daily  Monitor B and HR  q shift x next 3 days. Titrate up as needed on tolerated to SBP goal of < 150

## 2014-02-08 NOTE — Progress Notes (Signed)
  Geri Attending Progress note  Subjective:    Patient ID: [name], female    DOB: 10/04/1918, 80 y.o.   MRN: [id number]  HPI Patient seen for routine visit:  Complaint/concerns:  1. Falls reported w/o injury or LOC. Questioned patient. She denies injury and pain. She does not recall details of falls.   2. ? Sedation: there was a question of patient being overly sedated on seroquel. Patient reports resting well at night and not being tired in the daytime. She is found at the bingo table alert and talkative.   3. Elevated BP: noted elevated BP x past two readings 01/17/14 and 02/02/14. Elevation was recorded, but did not trigger the increased monitoring by the nursing staff. Additionally, MD was not notified. Patient denies HA, CP, SOB, LE edema.   Review of Systems As per HPI     Objective:   Physical Exam BP 194/87  Pulse 56  Temp(Src) 98.2 F (36.8 C)  Resp 18 BP Readings from Last 3 Encounters:  02/02/14 194/87  12/16/13 112/68  12/04/13 143/65  General appearance: alert, cooperative and no distress Lungs: clear to auscultation bilaterally Heart: regular rate and rhythm, S1, S2 normal, no murmur, click, rub or gallop Extremities: edema trace b/l Neuro: oriented to person, not place or time     Assessment & Plan:

## 2014-02-08 NOTE — Patient Instructions (Signed)
Per skilled facility sheet 

## 2014-02-08 NOTE — Progress Notes (Signed)
[facility]  PATIENT: [name] DOB: 12/04/1917   REASON FOR VISIT: Dementia possibly Lewy body dementia   HISTORY OF PRESENT ILLNESS:[name], 80 year old female returns for followup. She was last in the office by Dr. Anne HahnWillis 08/11/2013. At that time she was sleeping a lot during the day and staying awake at night. The patient is currently in a skilled facility and is unable to walk alone. She tries to get up but has falls fortunately without injury since last seen. She returns today with her granddaughter. She reportedly is eating well. She is currently on Seroquel 50 at night. This is ordered for her primary care. Dr. Anne HahnWillis had ordered trazodone at her last visit but this was not started. She returns for reevaluation.   HISTORY: of a progressive memory disorder, and a history of a gait disorder. The patient currently is in an extended care facility. She remains a fall risk, and she will commonly try to get out of bed at night, and she is unable to walk independently. The patient has a tendency to lean backwards. The patient generally will nap during the day, and stay awake during the nighttime. The patient will try to get up several times out of bed at night. The patient has a bed alarm, but oftentimes she will fall before her caretakers can get to her. The patient has not sustained significant injury from falls fortunately. The patient has trazodone at night as needed for sleep. The patient may go several days or nights without sleep. The patient is eating well, with no reports of hallucinations. The patient returns for an evaluation.   REVIEW OF SYSTEMS: Full 14 system review of systems performed and notable only for those listed, all others are neg:  Constitutional: N/A  Cardiovascular: N/A  Ear/Nose/Throat: N/A  Skin: N/A  Eyes: N/A  Respiratory: N/A  Gastroitestinal: N/A  Hematology/Lymphatic: N/A  Endocrine: N/A Musculoskeletal:N/A  Allergy/Immunology: N/A    Neurological: Memory loss  Psychiatric: Agitation, confusion, anxiety Sleep frequent wakening, daytime sleepiness, sleep walking attempts ALLERGIES: Allergies  Allergen Reactions  . Seroquel [Quetiapine Fumarate] Other (See Comments)    Unknown     HOME MEDICATIONS: Outpatient Prescriptions Prior to Visit  Medication Sig Dispense Refill  . acetaminophen (TYLENOL) 325 MG tablet Take 650 mg by mouth every 4 (four) hours as needed for pain.      Marland Kitchen. amLODipine (NORVASC) 5 MG tablet Take 1 tablet (5 mg total) by mouth daily.  90 tablet  3  . aspirin 81 MG tablet Take 81 mg by mouth daily.      Marland Kitchen. atenolol (TENORMIN) 100 MG tablet Take 50 mg by mouth daily.      . brimonidine-timolol (COMBIGAN) 0.2-0.5 % ophthalmic solution Place 1 drop into the right eye 2 (two) times daily. Wait 3-5 minutes between eye drops.      . citalopram (CELEXA) 20 MG tablet Take 1 tablet (20 mg total) by mouth daily.      Marland Kitchen. docusate sodium (COLACE) 100 MG capsule Take 100 mg by mouth daily. as needed for constipation      . latanoprost (XALATAN) 0.005 % ophthalmic solution Place 1 drop into the right eye at bedtime.      Marland Kitchen. lisinopril (PRINIVIL,ZESTRIL) 40 MG tablet Take 0.5 tablets (20 mg total) by mouth daily.  30 tablet  0  . Multiple Vitamins-Minerals (OCUVITE PRESERVISION PO) Take 1 tablet by mouth daily.      . QUEtiapine (SEROQUEL) 25 MG tablet Take  2 tablets (50mg  total) by mouth at bedtime.      . simvastatin (ZOCOR) 40 MG tablet Take 40 mg by mouth every evening.       . Skin Protectants, Misc. (BAZA PROTECT EX) Apply 1 application topically 3 (three) times daily.      . Vitamin D, Ergocalciferol, (DRISDOL) 50000 UNITS CAPS capsule Take 50,000 Units by mouth every 30 (thirty) days.       No facility-administered medications prior to visit.    PAST MEDICAL HISTORY: Past Medical History  Diagnosis Date  . Hypertension   . Dementia     Possible Lewy body dementia  . Hyperlipidemia   . Diabetes   . CAD  (coronary artery disease)   . Degenerative arthritis   . Gait disorder   . Glaucoma   . Dementia with Lewy bodies 03/27/2013    PAST SURGICAL HISTORY: Past Surgical History  Procedure Laterality Date  . Abdominal hysterectomy      for uterine fibroids  . Coronary artery bypass graft    . Total knee arthroplasty Left   . Cataract extraction, bilateral      FAMILY HISTORY: Family History  Problem Relation Age of Onset  . Stroke Father   . Cancer Brother   . Heart failure Brother     SOCIAL HISTORY: History   Social History  . Marital Status: Widowed    Spouse Name: N/A    Number of Children: N/A  . Years of Education: N/A   Occupational History  . Not on file.   Social History Main Topics  . Smoking status: Never Smoker   . Smokeless tobacco: Never Used  . Alcohol Use: No  . Drug Use: No  . Sexual Activity: No   Other Topics Concern  . Not on file   Social History Narrative  . No narrative on file     PHYSICAL EXAM  Filed Vitals:   02/08/14 1028  BP: 112/63  Pulse: 56   Cannot calculate BMI with a height equal to zero.  Generalized: Well developed, in no acute distress  Skin no peripheral edema noted Neurological examination   Mentation: Alert  uncooperative for Mini-Mental Status exam ,Follows a few  commands, word finding difficulty  Cranial nerve II-XII: Pupils were equal round reactive to light extraocular movements were full, visual field were full on confrontational test. Facial sensation and strength were normal. hearing was intact to finger rubbing bilaterally. Uvula tongue midline. head turning and shoulder shrug were normal and symmetric.Tongue protrusion into cheek strength was normal. Motor: normal bulk and tone, full strength in the BUE, BLE,  No focal weakness Coordination: finger-nose-finger, apraxia noted with the use of extremities  Reflexes: Deep tendon reflexes are symmetric upper and lower  Gait and Station: Rising up from seated  position with assistance of 2 persons to stand, once up she is unable to remain standing without assistance she only ambulated a few steps patient falls backwards, tandem gait was not attempted DIAGNOSTIC DATA (LABS, IMAGING, TESTING) - I reviewed patient records, labs, notes, testing and imaging myself where available.  Lab Results  Component Value Date   WBC 7.0 05/14/2013   HGB 10.3* 05/14/2013   HCT 29.3* 05/14/2013   MCV 93.3 05/14/2013   PLT 206 05/14/2013      Component Value Date/Time   NA 139 05/14/2013 0604   K 3.9 05/14/2013 0604   CL 106 05/14/2013 0604   CO2 26 05/14/2013 0604   GLUCOSE 71 05/14/2013 0604  BUN 12 05/14/2013 0604   CREATININE 0.86 05/14/2013 0604   CALCIUM 9.1 05/14/2013 0604   PROT 5.6* 05/14/2013 0604   ALBUMIN 2.8* 05/14/2013 0604   AST 18 05/14/2013 0604   ALT 15 05/14/2013 0604   ALKPHOS 58 05/14/2013 0604   BILITOT 1.2 05/14/2013 0604   GFRNONAA 56* 05/14/2013 0604   GFRAA 65* 05/14/2013 0604       ASSESSMENT AND PLAN  79 y.o. year old female  has a past medical history of  Gait disorder; and Dementia with Lewy bodies (03/27/2013). here to followup. Her memory disorder  is progressive.  Will try trazodone 50 mg at at bedtime every night Followup in 6 months Nilda Riggs, [facility], [facility], APRN  [facility] [address] [place], [place] [address] [phone]

## 2014-02-08 NOTE — Progress Notes (Signed)
I have read the note, and I agree with the clinical assessment and plan.  Aubrielle Stroud KEITH   

## 2014-02-15 ENCOUNTER — Encounter: Payer: Self-pay | Admitting: Pharmacist

## 2014-02-16 ENCOUNTER — Non-Acute Institutional Stay (INDEPENDENT_AMBULATORY_CARE_PROVIDER_SITE_OTHER): Payer: Medicare Other | Admitting: Family Medicine

## 2014-02-16 DIAGNOSIS — G3183 Dementia with Lewy bodies: Secondary | ICD-10-CM

## 2014-02-16 DIAGNOSIS — F0280 Dementia in other diseases classified elsewhere without behavioral disturbance: Secondary | ICD-10-CM

## 2014-02-16 DIAGNOSIS — I10 Essential (primary) hypertension: Secondary | ICD-10-CM

## 2014-02-16 NOTE — Progress Notes (Signed)
Patient ID: [name], female   DOB: 11/07/1918, 80 y.o.   MRN: [id number]  Lacey AlarEric Si Jachim, MD Phone: [phone]  Daiva EvesRosie W Etheleen Mccarthy is a 80 y.o. female seen in f/u.  Patient reports no complaints. States she is doing well. Does not remember being started on a medication for her blood pressure. No complaints of CP or SOB.   Spoke with nursing regarding patient. They report no current issues or concerns. States she had initially been sleeping a lot, though now is back to normal and has been doing quite well. Report needing a hard script for her ativan.   ROS: Per HPI   Physical Exam Filed Vitals:   02/16/14 0903  BP: 126/71  Pulse: 74  Temp: 98.4 F (36.9 C)  Resp: 22    Physical Examination: General appearance - alert, well appearing, and in no distress Mental status - oriented to person, though not to time or place Chest - clear to auscultation, no wheezes, rales or rhonchi, symmetric air entry Heart - normal rate, regular rhythm, normal S1, S2, no murmurs, rubs, clicks or gallops Extremities - no pedal edema noted   Assessment/Plan: Please see individual problem list.

## 2014-02-16 NOTE — Assessment & Plan Note (Addendum)
Oriented only to name. Able to converse well, but nonsensical at times. Appears to be at baseline. Sleep appears to be improved per nursing report since last nursing home visit. Will continue to monitor and will continue f/u with neurology.

## 2014-02-16 NOTE — Assessment & Plan Note (Signed)
Much improved with addition of amlodipine. Will continue current dosing of this medication. Will continue to monitor this issue.

## 2014-02-22 ENCOUNTER — Other Ambulatory Visit: Payer: Self-pay | Admitting: Family Medicine

## 2014-02-22 MED ORDER — LORAZEPAM 0.5 MG PO TABS: 0.5000 mg | ORAL_TABLET | Freq: Three times a day (TID) | ORAL | Status: DC | PRN

## 2014-02-22 NOTE — Telephone Encounter (Signed)
[facility]. Currently scheduled every 8 hours. Gave verbal order to change to Q8 hours PRN agitation.

## 2014-02-23 ENCOUNTER — Other Ambulatory Visit: Payer: Self-pay | Admitting: Family Medicine

## 2014-02-23 MED ORDER — LORAZEPAM 0.5 MG PO TABS: 0.5000 mg | ORAL_TABLET | Freq: Three times a day (TID) | ORAL | Status: DC | PRN

## 2014-02-23 NOTE — Telephone Encounter (Signed)
[facility]  Ativan 30, 0 refills

## 2014-02-25 ENCOUNTER — Non-Acute Institutional Stay (INDEPENDENT_AMBULATORY_CARE_PROVIDER_SITE_OTHER): Payer: Medicare Other | Admitting: Family Medicine

## 2014-02-25 ENCOUNTER — Encounter: Payer: Self-pay | Admitting: Family Medicine

## 2014-02-25 DIAGNOSIS — Z593 Problems related to living in residential institution: Secondary | ICD-10-CM

## 2014-02-25 DIAGNOSIS — F079 Unspecified personality and behavioral disorder due to known physiological condition: Secondary | ICD-10-CM

## 2014-02-25 NOTE — Progress Notes (Signed)
[facility] Patient Lacey LassoRosie W Mccarthy 80 y.o. female  MRN: [id number]  DOB: 12/09/1917 PCP: Glori LuisEric G Sonnenberg, MD      Situation:  Patient with history of wandering at nighttime. Has not had any recent issues. She recently had Ativan changed to prn. She has not received daytime doses but has received some at night due to restlessness. Nursing staff reports daytime somnolence on days she gets Ativan the ngiht before. Plan to reevaluate current psychotropic medicines at this time.      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

## 2014-02-25 NOTE — Assessment & Plan Note (Signed)
History of nighttime wandering/insomnia  Increased trazodone to 75 mg QHS  Encourage use of trazodone for at least 1 hour before trying Ativan  Discontinue Seroquel (consider restarting if any wandering/agitation at night)

## 2014-03-06 ENCOUNTER — Telehealth: Payer: Self-pay | Admitting: Family Medicine

## 2014-03-06 NOTE — Telephone Encounter (Signed)
We are called form [facility] that pt has been having cough with increase sputum production and one temp 101 per nurse report. Other vitals are stable and O2 sat is wnl. Pt does not seem SOB. xrays were ordered and we were called back with read of left lower lobe PNA.  A/P HCAP:  since pt seems stable and able to tolerate PO. Will start Levaquin 750mg  daily and mucinex PRN. Verbal orders were given to nursing as well as instructions for proper monitoring. If SOB, fever not responding to antipyretic or other symptoms appear instructed to bring pt to ED for further evaluation.  Signed: D. Piloto Rolene Arboure La Paz, MD Family Medicine  PGY-3

## 2014-03-10 NOTE — Telephone Encounter (Signed)
[facility] Patient Lacey LassoRosie W Mccarthy 80 y.o. female  MRN: [id number]  DOB: 07/14/1918 PCP: Glori LuisEric G Sonnenberg, MD      Situation:  See phone note below     Subjective:  Patient does not complain of cough or shortness of breath     History:  Treated for LLL pna starting 4/18 with levaquin x 7 days     Vitals:   Tm 98.4 RR 20 P 57 BP 140/58     Physical Exam: NAD, resting in bed Lungs: CTAB     Assessment & Plan: LLL PNA, appears to be improving with treatment. VSS. Will continue to monitor and finish course of antibiotics.

## 2014-04-01 ENCOUNTER — Emergency Department (HOSPITAL_COMMUNITY): Payer: Medicare Other

## 2014-04-01 ENCOUNTER — Emergency Department (HOSPITAL_COMMUNITY)
Admission: EM | Admit: 2014-04-01 | Discharge: 2014-04-01 | Disposition: A | Discharge status: Home / Self Care | Payer: Medicare Other | Attending: Emergency Medicine | Admitting: Emergency Medicine

## 2014-04-01 ENCOUNTER — Encounter (HOSPITAL_COMMUNITY): Payer: Self-pay | Admitting: Emergency Medicine

## 2014-04-01 ENCOUNTER — Telehealth: Payer: Self-pay | Admitting: Family Medicine

## 2014-04-01 DIAGNOSIS — M1990 Unspecified osteoarthritis, unspecified site: Secondary | ICD-10-CM | POA: Insufficient documentation

## 2014-04-01 DIAGNOSIS — R4182 Altered mental status, unspecified: Secondary | ICD-10-CM | POA: Diagnosis present

## 2014-04-01 DIAGNOSIS — Z593 Problems related to living in residential institution: Secondary | ICD-10-CM | POA: Diagnosis not present

## 2014-04-01 DIAGNOSIS — J189 Pneumonia, unspecified organism: Secondary | ICD-10-CM | POA: Insufficient documentation

## 2014-04-01 DIAGNOSIS — I2510 Atherosclerotic heart disease of native coronary artery without angina pectoris: Secondary | ICD-10-CM | POA: Insufficient documentation

## 2014-04-01 DIAGNOSIS — Z7982 Long term (current) use of aspirin: Secondary | ICD-10-CM | POA: Diagnosis not present

## 2014-04-01 DIAGNOSIS — I10 Essential (primary) hypertension: Secondary | ICD-10-CM | POA: Insufficient documentation

## 2014-04-01 DIAGNOSIS — Z79899 Other long term (current) drug therapy: Secondary | ICD-10-CM | POA: Insufficient documentation

## 2014-04-01 DIAGNOSIS — H409 Unspecified glaucoma: Secondary | ICD-10-CM | POA: Insufficient documentation

## 2014-04-01 DIAGNOSIS — Z951 Presence of aortocoronary bypass graft: Secondary | ICD-10-CM | POA: Diagnosis not present

## 2014-04-01 DIAGNOSIS — E785 Hyperlipidemia, unspecified: Secondary | ICD-10-CM | POA: Insufficient documentation

## 2014-04-01 DIAGNOSIS — Z789 Other specified health status: Secondary | ICD-10-CM | POA: Insufficient documentation

## 2014-04-01 DIAGNOSIS — E119 Type 2 diabetes mellitus without complications: Secondary | ICD-10-CM | POA: Diagnosis not present

## 2014-04-01 DIAGNOSIS — F0390 Unspecified dementia without behavioral disturbance: Secondary | ICD-10-CM | POA: Diagnosis not present

## 2014-04-01 MED ORDER — LEVOFLOXACIN 500 MG PO TABS: 500.0000 mg | ORAL_TABLET | Freq: Every day | ORAL | Status: DC

## 2014-04-01 NOTE — Discharge Instructions (Signed)
You do not have a pneumonia, but we are giving antibiotics for high-risk status as a nursing home patient and questionable finding on x-ray  Aspiration Precautions Aspiration is the inhaling of a liquid or object into the lungs. Things that can be inhaled into the lungs include:  Food.  Any type of liquid, such as drinks or saliva.  Stomach contents, such as vomit or stomach acid. When these things go into the lungs, damage can occur. Serious complications can then result, such as:  A lung infection (pneumonia).  A collection of pus in the lungs (lung abscess). CAUSES  A decreased level of awareness (consciousness) due to:  Traumatic brain injury or head injury.  Stroke.  Neurological disease.  Seizures.  Decreased or absent gag reflex (inability to cough).  Medical conditions that affect swallowing.  Conditions that affect the food pipe (esophagus) such as a narrowing of the esophagus (esophageal stricture).  Gastroesophageal reflux (GERD). This is also known as acid reflux.  Any type of surgery where you are put under general anesthesia or have sedation.  Drinking large amounts of alcohol.  Taking medication that causes drowsiness, confusion, or weakness.  Aging.  Dental problems.  Having a feeding tube. SYMPTOMS When aspiration occurs, different signs and symptoms can occur, such as:  Coughing (if a person has a cough or gag reflex) after swallowing food or liquids.  Difficulty breathing. This can include things like:  Breathing rapidly.  Breathing very slowly.  Hearing "gurgling" lung sounds when a person breaths.  Coughing up phlegm (sputum) that is:  Yellow, tan, or green in color.  Has pieces of food in it.  Bad smelling.  A change in voice (hoarseness).  A change in skin color. The skin may turn a "bluish" type color because of a lack of oxygen (cyanosis).  Fever. DIAGNOSIS  A chest X-ray may be performed. This takes a picture of your  lungs. It can show changes in the lungs if aspiration has occurred.  A bronchoscopy may be performed. This is a surgical procedure in which a thin, flexible tube with a camera at the end is inserted into the nose or mouth. The tube is advanced to the lungs so your caregiver can view the lungs and obtain a culture, tissue sample, or remove an aspirated object.  A swallowing evaluation study may be performed to evaluate:  A person's risk of aspiration.  How difficult it is for a person to swallow.  What types of foods are safe for a person to eat. PREVENTION If you are a caregiver to someone who may aspirate, follow the directions below. If you are caring for someone who can eat and drink through their mouth:  Have them sit in an upright position when eating food or drinking fluids, such as:  Sitting up in a chair.  If sitting in a chair is not possible, position the person in bed so they are upright.  Remind the person to eat slowly and chew well.  Do not distract the person. This is especially important for people with thinking or memory (cognitive) problems.  Check the person's mouth for leftover food after eating.  Keep the person sitting upright for 30 to 45 minutes after eating.  Do not serve food or drink for at least 2 hours before bedtime. If you are caring for someone with a feeding tube and he or she cannot eat or drink through their mouth:  Keep the person in an upright position as much as possible.  Do not  lay the person flat if they are getting continuous feedings. Turn the feeding pump off if you need to lay the person flat for any reason.  Check feeding tube residuals as directed by your caregiver. If a large amount of tube feedings are pulled back (aspirated) from the feeding tube, call your caregiver right away. General guidelines to prevent aspiration include:  Feed small amounts of food. Do not force feed.  Use as little water as possible when brushing the  person's teeth or cleaning his or her mouth.  Provide oral care before and after meals.  Never put food or fluids in the mouth of a person who is not fully alert.  Crush pills and put them in soft food such as pudding or ice cream. Some pills should not be crushed. Check with your caregiver before crushing any medication. SEEK IMMEDIATE MEDICAL CARE IF:   The person has trouble breathing or starts to breathe rapidly.  The person is breathing very slowly or stops breathing.  The person coughs a lot after eating or drinking.  The person has a chronic cough.  The person coughs up thick, yellow, or tan sputum.  The person has a fever or persistent symptoms for more than 72 hours.  The person has a fever and their symptoms suddenly get worse. Document Released: 12/08/2010 Document Revised: 01/28/2012 Document Reviewed: 12/08/2010 [facility] Patient Information 2014 [employer] [place].

## 2014-04-01 NOTE — ED Notes (Signed)
[facility].

## 2014-04-01 NOTE — ED Provider Notes (Signed)
CSN: [id number]     Arrival date & time 04/01/14  1759 History   First MD Initiated Contact with Patient 04/01/14 1807     Chief Complaint  Patient presents with  . Altered Mental Status     (Consider location/radiation/quality/duration/timing/severity/associated sxs/prior Treatment) Patient is a 80 y.o. female presenting with altered mental status. The history is provided by medical records.  Altered Mental Status Presenting symptoms comment:  Been more tired than usual and concerned from medical staff about aspiration with unwitnessed sleeping episode and food on chest Severity:  Mild Most recent episode:  Today Episode history:  Continuous Timing:  Constant Progression:  Resolved Chronicity:  New Context: dementia (patient noncontributory Historian)     Past Medical History  Diagnosis Date  . Hypertension   . Dementia     Possible Lewy body dementia  . Hyperlipidemia   . Diabetes   . CAD (coronary artery disease)   . Degenerative arthritis   . Gait disorder   . Glaucoma   . Dementia with Lewy bodies 03/27/2013   Past Surgical History  Procedure Laterality Date  . Abdominal hysterectomy      for uterine fibroids  . Coronary artery bypass graft    . Total knee arthroplasty Left   . Cataract extraction, bilateral     Family History  Problem Relation Age of Onset  . Stroke Father   . Cancer Brother   . Heart failure Brother    History  Substance Use Topics  . Smoking status: Never Smoker   . Smokeless tobacco: Never Used  . Alcohol Use: No   OB History   Grav Para Term Preterm Abortions TAB SAB Ect Mult Living                 Review of Systems  Unable to perform ROS: Dementia      Allergies  Seroquel  Home Medications   Prior to Admission medications   Medication Sig Start Date End Date Taking? Authorizing Provider  acetaminophen (TYLENOL) 325 MG tablet Take 650 mg by mouth every 4 (four) hours as needed for pain.   Yes Historical Provider, MD   amLODipine (NORVASC) 5 MG tablet Take 1 tablet (5 mg total) by mouth daily. 02/04/14  Yes Josalyn C Funches, MD  aspirin 81 MG tablet Take 81 mg by mouth daily.   Yes Historical Provider, MD  atenolol (TENORMIN) 100 MG tablet Take 50 mg by mouth daily.   Yes Historical Provider, MD  brimonidine-timolol (COMBIGAN) 0.2-0.5 % ophthalmic solution Place 1 drop into the right eye 2 (two) times daily. Wait 3-5 minutes between eye drops.   Yes Historical Provider, MD  citalopram (CELEXA) 20 MG tablet Take 1 tablet (20 mg total) by mouth daily. 09/23/13  Yes Josalyn C Funches, MD  docusate sodium (COLACE) 100 MG capsule Take 100 mg by mouth daily. as needed for constipation   Yes Historical Provider, MD  latanoprost (XALATAN) 0.005 % ophthalmic solution Place 1 drop into the right eye at bedtime.   Yes Historical Provider, MD  lisinopril (PRINIVIL,ZESTRIL) 40 MG tablet Take 0.5 tablets (20 mg total) by mouth daily. 12/17/13  Yes Garnetta BuddyEdward V Williamson, MD  LORazepam (ATIVAN) 0.5 MG tablet Take 1 tablet (0.5 mg total) by mouth every 8 (eight) hours as needed for anxiety. 02/23/14  Yes Josalyn C Funches, MD  Multiple Vitamins-Minerals (OCUVITE PRESERVISION PO) Take 1 tablet by mouth daily.   Yes Historical Provider, MD  QUEtiapine (SEROQUEL) 25 MG tablet Take 25  mg by mouth at bedtime.   Yes Historical Provider, MD  simvastatin (ZOCOR) 40 MG tablet Take 40 mg by mouth every evening.    Yes Historical Provider, MD  Skin Protectants, Misc. (BAZA PROTECT EX) Apply 1 application topically 3 (three) times daily.   Yes Historical Provider, MD  traZODone (DESYREL) 50 MG tablet Take 50 mg by mouth at bedtime.   Yes Historical Provider, MD  Vitamin D, Ergocalciferol, (DRISDOL) 50000 UNITS CAPS capsule Take 50,000 Units by mouth every 30 (thirty) days.   Yes Historical Provider, MD   BP 158/73  Pulse 57  Temp(Src) 98.4 F (36.9 C) (Oral)  Resp 16  SpO2 94% Physical Exam  Constitutional: She appears well-developed and  well-nourished. No distress.  HENT:  Head: Normocephalic.  Eyes: Conjunctivae are normal.  Neck: Neck supple. No tracheal deviation present.  Cardiovascular: Normal rate, regular rhythm and normal heart sounds.   No murmur heard. Pulmonary/Chest: Effort normal. No respiratory distress. She has no wheezes. She has no rales.  Abdominal: Soft. She exhibits no distension. There is no tenderness. There is no rebound and no guarding.  Neurological: She is alert. She is disoriented (At baseline per documentation).  Skin: Skin is warm and dry.  Psychiatric: She has a normal mood and affect.    ED Course  Procedures (including critical care time) Labs Review Labs Reviewed - No data to display  Imaging Review Dg Chest 2 View  04/01/2014   CLINICAL DATA:  Altered mental status.  EXAM: CHEST  2 VIEW  COMPARISON:  05/13/2013  FINDINGS: Sternotomy wires are present. Lungs are adequately inflated and demonstrate mild prominence of the perihilar/infrahilar markings suggesting vascular congestion. There is mild opacification over the posterior lung base on the lateral film as cannot exclude atelectasis versus infection. There is no effusion. There is mild cardiomegaly unchanged. Remainder the exam is unchanged.  IMPRESSION: Mild opacification of the posterior lung bases on the lateral film as cannot exclude atelectasis versus infection.  Mild perihilar/infrahilar opacification suggesting vascular congestion. Stable cardiomegaly.   Electronically Signed   By: Elberta Fortisaniel  Boyle M.D.   On: 04/01/2014 19:26     EKG Interpretation None      MDM   Final diagnoses:  Nursing home resident  Pneumonia    80 y.o. female presents with episode concerning for nursing facility that the patient may have aspirated. She was found in her room a sleep with trail makes over her chest. She was arousable and alert at baseline demented status according to the report from nursing except for a little more tired. Screening  chest x-ray to look for signs of aspiration pneumonitis and none is identified. Due to high-risk patient demographic and questionable findings at bases of lungs for pneumonia we will cover her with Levaquin over the next week for atypicals. Patient is pleasant and awake speaking to staff throughout her emergency department visit.  Lyndal Pulleyaniel Cicely Ortner, MD 04/02/14 [phone]

## 2014-04-01 NOTE — Telephone Encounter (Signed)
[facility] was difficult to arouse.   Nurse reports that her Vitals are stable but she only grimaces to pain. Advised her to arrange for her to be seen in the ED.

## 2014-04-01 NOTE — ED Notes (Signed)
[facility] from [facility]. Was found in bed lethargic with peanut debris on her chest and clothes. Staff reports that pt is more lethargic today and possibly aspirated on her food.

## 2014-04-02 ENCOUNTER — Non-Acute Institutional Stay (INDEPENDENT_AMBULATORY_CARE_PROVIDER_SITE_OTHER): Payer: Medicare Other | Admitting: Family Medicine

## 2014-04-02 DIAGNOSIS — J189 Pneumonia, unspecified organism: Secondary | ICD-10-CM

## 2014-04-02 DIAGNOSIS — F0280 Dementia in other diseases classified elsewhere without behavioral disturbance: Secondary | ICD-10-CM

## 2014-04-02 DIAGNOSIS — G3183 Dementia with Lewy bodies: Secondary | ICD-10-CM

## 2014-04-02 DIAGNOSIS — I10 Essential (primary) hypertension: Secondary | ICD-10-CM

## 2014-04-02 NOTE — Progress Notes (Signed)
Patient ID: Lacey LassoRosie W Mccarthy, female   DOB: 01/24/1918, 80 y.o.   MRN: [id number]  Lacey AlarEric Anatasia Tino, MD Phone: [phone]  [name] is a 80 y.o. female who is seen today for a nursing home visit.  Patient reports no complaints today.  PNA: patient seen yesterday evening in the ED due to concern for aspiration PNA as she had an unwitnessed sleeping episode and was found to have food on her chest. CXR revealed mild opacification of the posterior lung bases and she was started on levaquin to take daily for 7 days. She reports no chest pain or trouble breathing today. She states she feels fine.  HTN: is currently on amlodipine, atenolol, lisinopril. Her most recent BP was 124/61. She denies CP, SOB, and edema.  Dementia: roommate reports patient accuses her of stealing her clothes due to patient not remembering her daughter coming to get them to launder them. No other reports of changes in activity or memory.   ROS: Per HPI   Physical Exam Filed Vitals:   04/03/14 0013  BP: 124/61  Pulse: 58  Temp: 96.3 F (35.7 C)  Resp: 20    Gen: Well NAD HEENT: PERRL,  MMM Lungs: mild crackles RLL Nl WOB Heart: RRR no MRG Exts: Non edematous BL  LE, warm and well perfused.  Psych: alert, oriented to person only   Assessment/Plan: Please see individual problem list.

## 2014-04-03 DIAGNOSIS — J189 Pneumonia, unspecified organism: Secondary | ICD-10-CM

## 2014-04-03 HISTORY — DX: Pneumonia, unspecified organism: J18.9

## 2014-04-03 NOTE — Assessment & Plan Note (Signed)
Currently at goal. Will continue current therapy. Will continue to monitor BP.

## 2014-04-03 NOTE — Assessment & Plan Note (Signed)
Appears stable at this time. Will continue to monitor this and plan for continued follow-up with neurology.

## 2014-04-03 NOTE — Assessment & Plan Note (Signed)
Diagnosed with PNA 5/14 and start on levaquin. Vitals have been stable and she has been afebrile. Lung exam with evidence of few crackles. Will continue levaquin for 7 day course. Continue to monitor for decompensation.

## 2014-04-05 NOTE — ED Provider Notes (Signed)
I saw and evaluated the patient, reviewed the resident's note and I agree with the findings and plan.   EKG Interpretation None         Dagmar HaitWilliam Emmerie Battaglia, MD 04/05/14 [phone]

## 2014-04-07 ENCOUNTER — Telehealth: Payer: Self-pay | Admitting: Family Medicine

## 2014-04-07 ENCOUNTER — Non-Acute Institutional Stay: Payer: Medicare Other | Admitting: Family Medicine

## 2014-04-07 DIAGNOSIS — F3289 Other specified depressive episodes: Secondary | ICD-10-CM

## 2014-04-07 DIAGNOSIS — F32A Depression, unspecified: Secondary | ICD-10-CM

## 2014-04-07 DIAGNOSIS — F329 Major depressive disorder, single episode, unspecified: Secondary | ICD-10-CM

## 2014-04-07 DIAGNOSIS — J189 Pneumonia, unspecified organism: Secondary | ICD-10-CM

## 2014-04-07 DIAGNOSIS — I10 Essential (primary) hypertension: Secondary | ICD-10-CM

## 2014-04-07 NOTE — Telephone Encounter (Signed)
Nurse calling patient very somnolent. She is sleeping and difficult to arouse. Patient is usually awake all night and was last night. Vitals stable and CBG 77. I told them to continue to observe. RN voiced understanding.

## 2014-04-07 NOTE — Assessment & Plan Note (Addendum)
Patient sleeping more this AM. Most likely just fatigue but will need to monitor for depression vs decompensation from recent PNA (5/14) Plan to continue celexa

## 2014-04-07 NOTE — Assessment & Plan Note (Signed)
BP well-controlled -Continue current regimen 

## 2014-04-07 NOTE — Progress Notes (Signed)
  Geriatric Attending Note  Subjective:    Patient ID: Timothy LassoRosie W Mchaffie, female    DOB: 01/17/1918, 80 y.o.   MRN: [id number] CC: none. Routine visit.  HPI 80 yo F NH patient seen for routine visit. No complaints. No acute events. Patient is found sleeping in bed this AM. When I saw her on the afternoon of 5/18 she was dressed, up and playing bingo. She reports that she stayed up to 3 AM last night.    Review of Systems Patient denies pain, CP, SOB, GI upset.     Objective:   Physical Exam BP 115/50  Pulse 60  Temp(Src) 98.3 F (36.8 C)  Resp 20 General appearance: cooperative and no distress Lungs: clear to auscultation bilaterally Heart: regular rate and rhythm, S1, S2 normal, no murmur, click, rub or gallop Extremities: extremities normal, atraumatic, no cyanosis or edema     Assessment & Plan:

## 2014-04-07 NOTE — Assessment & Plan Note (Signed)
Last day of levaquin for PNA dx 5/14. Patient resting. Lung exam nml, no fever. Will continue to monitor. Removed levaquin from med list.

## 2014-04-09 ENCOUNTER — Encounter: Payer: Self-pay | Admitting: Pharmacist

## 2014-04-10 ENCOUNTER — Emergency Department (HOSPITAL_COMMUNITY): Payer: Medicare Other

## 2014-04-10 ENCOUNTER — Emergency Department (HOSPITAL_COMMUNITY)
Admission: EM | Admit: 2014-04-10 | Discharge: 2014-04-10 | Disposition: A | Discharge status: Home / Self Care | Payer: Medicare Other | Attending: Emergency Medicine | Admitting: Emergency Medicine

## 2014-04-10 ENCOUNTER — Telehealth: Payer: Self-pay | Admitting: Family Medicine

## 2014-04-10 ENCOUNTER — Encounter (HOSPITAL_COMMUNITY): Payer: Self-pay | Admitting: Emergency Medicine

## 2014-04-10 DIAGNOSIS — I10 Essential (primary) hypertension: Secondary | ICD-10-CM | POA: Insufficient documentation

## 2014-04-10 DIAGNOSIS — E785 Hyperlipidemia, unspecified: Secondary | ICD-10-CM | POA: Insufficient documentation

## 2014-04-10 DIAGNOSIS — Z7982 Long term (current) use of aspirin: Secondary | ICD-10-CM | POA: Insufficient documentation

## 2014-04-10 DIAGNOSIS — M1990 Unspecified osteoarthritis, unspecified site: Secondary | ICD-10-CM | POA: Insufficient documentation

## 2014-04-10 DIAGNOSIS — Y9389 Activity, other specified: Secondary | ICD-10-CM | POA: Insufficient documentation

## 2014-04-10 DIAGNOSIS — F0390 Unspecified dementia without behavioral disturbance: Secondary | ICD-10-CM | POA: Insufficient documentation

## 2014-04-10 DIAGNOSIS — I2510 Atherosclerotic heart disease of native coronary artery without angina pectoris: Secondary | ICD-10-CM | POA: Insufficient documentation

## 2014-04-10 DIAGNOSIS — S1093XA Contusion of unspecified part of neck, initial encounter: Principal | ICD-10-CM

## 2014-04-10 DIAGNOSIS — S0083XA Contusion of other part of head, initial encounter: Secondary | ICD-10-CM | POA: Insufficient documentation

## 2014-04-10 DIAGNOSIS — Y9210 Unspecified residential institution as the place of occurrence of the external cause: Secondary | ICD-10-CM | POA: Insufficient documentation

## 2014-04-10 DIAGNOSIS — Z79899 Other long term (current) drug therapy: Secondary | ICD-10-CM | POA: Insufficient documentation

## 2014-04-10 DIAGNOSIS — Z951 Presence of aortocoronary bypass graft: Secondary | ICD-10-CM | POA: Insufficient documentation

## 2014-04-10 DIAGNOSIS — Z8669 Personal history of other diseases of the nervous system and sense organs: Secondary | ICD-10-CM | POA: Insufficient documentation

## 2014-04-10 DIAGNOSIS — E119 Type 2 diabetes mellitus without complications: Secondary | ICD-10-CM | POA: Insufficient documentation

## 2014-04-10 DIAGNOSIS — IMO0002 Reserved for concepts with insufficient information to code with codable children: Secondary | ICD-10-CM | POA: Insufficient documentation

## 2014-04-10 DIAGNOSIS — W050XXA Fall from non-moving wheelchair, initial encounter: Secondary | ICD-10-CM | POA: Insufficient documentation

## 2014-04-10 DIAGNOSIS — S0003XA Contusion of scalp, initial encounter: Secondary | ICD-10-CM | POA: Insufficient documentation

## 2014-04-10 NOTE — Discharge Instructions (Signed)
Facial or Scalp Contusion ° A facial or scalp contusion is a deep bruise on the face or head. Contusions happen when an injury causes bleeding under the skin. Signs of bruising include pain, puffiness (swelling), and discolored skin. The contusion may turn blue, purple, or yellow. °HOME CARE °· Only take medicines as told by your doctor. °· Put ice on the injured area. °· Put ice in a plastic bag. °· Place a towel between your skin and the bag. °· Leave the ice on for 20 minutes, 2 3 times a day. °GET HELP IF: °· You have bite problems. °· You have pain when chewing. °· You are worried about your face not healing normally. °GET HELP RIGHT AWAY IF:  °· You have severe pain or a headache and medicine does not help. °· You are very tired or confused, or your personality changes. °· You throw up (vomit). °· You have a nosebleed that will not stop. °· You see two of everything (double vision) or have blurry vision. °· You have fluid coming from your nose or ear. °· You have problems walking or using your arms or legs. °MAKE SURE YOU:  °· Understand these instructions. °· Will watch your condition. °· Will get help right away if you are not doing well or get worse. °Document Released: 10/25/2011 Document Revised: 08/26/2013 Document Reviewed: 06/18/2013 °[employer]® Patient Information ©2014 [employer]. ° °

## 2014-04-10 NOTE — ED Notes (Signed)
Pt returned from CT and XR.

## 2014-04-10 NOTE — ED Notes (Signed)
[facility] called for transport.  

## 2014-04-10 NOTE — ED Notes (Signed)
MD at bedside. 

## 2014-04-10 NOTE — ED Provider Notes (Signed)
CSN: [id number]     Arrival date & time 04/10/14  0544 History   First MD Initiated Contact with Patient 04/10/14 0550     Chief Complaint  Patient presents with  . Fall      HPI  Patient presents after a fall at her extended care facility. She was having difficulty with insomnia month ago. Her Seroquel was stopped. She was started on trazodone. This was increased to 75 mg about 2 weeks ago. Also gets when necessary Ativan she is agitated or active at night. She had a dose of Ativan after midnight last night.  She slid out of her wheelchair onto her knees and then forward. Has an abrasion to her forehead. Head complaints her shoulder pain but not now. History of dementia.  Past Medical History  Diagnosis Date  . Hypertension   . Dementia     Possible Lewy body dementia  . Hyperlipidemia   . Diabetes   . CAD (coronary artery disease)   . Degenerative arthritis   . Gait disorder   . Glaucoma   . Dementia with Lewy bodies 03/27/2013   Past Surgical History  Procedure Laterality Date  . Abdominal hysterectomy      for uterine fibroids  . Coronary artery bypass graft    . Total knee arthroplasty Left   . Cataract extraction, bilateral     Family History  Problem Relation Age of Onset  . Stroke Father   . Cancer Brother   . Heart failure Brother    History  Substance Use Topics  . Smoking status: Never Smoker   . Smokeless tobacco: Never Used  . Alcohol Use: No   OB History   Grav Para Term Preterm Abortions TAB SAB Ect Mult Living                 Review of Systems  Unable to perform ROS: Dementia   level V caveat for review of systems because of dementia    Allergies  Seroquel  Home Medications   Prior to Admission medications   Medication Sig Start Date End Date Taking? Authorizing Provider  acetaminophen (TYLENOL) 325 MG tablet Take 650 mg by mouth every 4 (four) hours as needed for pain.    Historical Provider, MD  amLODipine (NORVASC) 5 MG tablet Take 1  tablet (5 mg total) by mouth daily. 02/04/14   Lora Paula, MD  aspirin 81 MG tablet Take 81 mg by mouth daily.    Historical Provider, MD  atenolol (TENORMIN) 100 MG tablet Take 50 mg by mouth daily.    Historical Provider, MD  brimonidine-timolol (COMBIGAN) 0.2-0.5 % ophthalmic solution Place 1 drop into both eyes 2 (two) times daily. Wait 3-5 minutes between eye drops.    Historical Provider, MD  citalopram (CELEXA) 20 MG tablet Take 1 tablet (20 mg total) by mouth daily. 09/23/13   Josalyn C Funches, MD  docusate sodium (COLACE) 100 MG capsule Take 100 mg by mouth daily. as needed for constipation    Historical Provider, MD  latanoprost (XALATAN) 0.005 % ophthalmic solution Place 1 drop into both eyes at bedtime.     Historical Provider, MD  lisinopril (PRINIVIL,ZESTRIL) 40 MG tablet Take 0.5 tablets (20 mg total) by mouth daily. 12/17/13   Garnetta Buddy, MD  LORazepam (ATIVAN) 0.5 MG tablet Take 1 tablet (0.5 mg total) by mouth every 8 (eight) hours as needed for anxiety. 02/23/14   Lora Paula, MD  Multiple Vitamins-Minerals (OCUVITE PRESERVISION PO)  Take 1 tablet by mouth daily.    Historical Provider, MD  simvastatin (ZOCOR) 40 MG tablet Take 40 mg by mouth every evening.     Historical Provider, MD  Skin Protectants, Misc. (BAZA PROTECT EX) Apply 1 application topically 3 (three) times daily.    Historical Provider, MD  traZODone (DESYREL) 50 MG tablet Take 75 mg by mouth at bedtime.     Historical Provider, MD  Vitamin D, Ergocalciferol, (DRISDOL) 50000 UNITS CAPS capsule Take 50,000 Units by mouth every 30 (thirty) days.    Historical Provider, MD   BP 143/58  Pulse 66  Temp(Src) 97.6 F (36.4 C) (Oral)  Resp 16  SpO2 99% Physical Exam  Constitutional: She is oriented to person, place, and time. She appears well-developed and well-nourished. No distress.  Thin frail-appearing black female. Hard of hearing. Able to answer simple questions.  HENT:  Head: Normocephalic.      No blood over the TMs, mastoids, or from ears nose or mouth.  Eyes: Conjunctivae are normal. Pupils are equal, round, and reactive to light. No scleral icterus.  Neck: Normal range of motion. Neck supple. No thyromegaly present.  She is able to tell me that she is tender as I touch on the bruising about her forehead. As I palpate her spine she is able to me that it does not cause pain. She can flex and extend her neck and rotate side to side at my request. She denies pain.  Cardiovascular: Normal rate and regular rhythm.  Exam reveals no gallop and no friction rub.   No murmur heard. Pulmonary/Chest: Effort normal and breath sounds normal. No respiratory distress. She has no wheezes. She has no rales.  Abdominal: Soft. Bowel sounds are normal. She exhibits no distension. There is no tenderness. There is no rebound.  Musculoskeletal: Normal range of motion.  Normal range of motion of the shoulders elbows wrists, knees, hips, ankles. Without complaint.  Neurological: She is alert and oriented to person, place, and time.  Skin: Skin is warm and dry. No rash noted.  Psychiatric: She has a normal mood and affect. Her behavior is normal.    ED Course  Procedures (including critical care time) Labs Review Labs Reviewed - No data to display  Imaging Review Ct Head Wo Contrast  04/10/2014   CLINICAL DATA:  Status post fall. Headache and swelling above the right eye.  EXAM: CT HEAD WITHOUT CONTRAST  TECHNIQUE: Contiguous axial images were obtained from the base of the skull through the vertex without intravenous contrast.  COMPARISON:  CT of the head performed 05/13/2013  FINDINGS: There is no evidence of acute infarction, mass lesion, or intra- or extra-axial hemorrhage on CT.  Prominence of the ventricles and sulci reflects moderate cortical volume loss. Scattered periventricular white matter change likely reflects small vessel ischemic microangiopathy. Cerebellar atrophy is noted.  The brainstem  and fourth ventricle are within normal limits. The basal ganglia are unremarkable in appearance. The cerebral hemispheres demonstrate grossly normal gray-white differentiation. No mass effect or midline shift is seen.  There is no evidence of fracture; visualized osseous structures are unremarkable in appearance. The visualized portions of the orbits are within normal limits. The paranasal sinuses and mastoid air cells are well-aerated. Mild soft tissue swelling is noted overlying the right frontal calvarium.  IMPRESSION: 1. No evidence of traumatic intracranial injury or fracture. 2. Moderate cortical volume loss and scattered small vessel ischemic microangiopathy. 3. Mild soft tissue swelling noted overlying the right frontal calvarium.  Electronically Signed   By: Roanna Raider M.D.   On: 04/10/2014 06:54     EKG Interpretation None      MDM   Final diagnoses:  Forehead contusion    Normal CT scan of the head exception of age related changes. No skull fracture. No extra-axial fluid or blood collections. No injuries to the shoulder radiographically.    Rolland Porter, MD 04/10/14 [phone]

## 2014-04-10 NOTE — ED Notes (Signed)
Pt coming from [place] with c/o fall. Pt feel asleep slid face first out of wheelchair. Pt presents with swelling above right eye. No blood thinners. Per staff pt is at neuro baseline, alert, oriented to person only. Respirations equal and unlabored, skin warm and dry

## 2014-04-10 NOTE — ED Notes (Signed)
Patient transported to CT 

## 2014-04-10 NOTE — Telephone Encounter (Signed)
Called by nursing for after hours lines stating pt had fall and hit her head and now has contusion.  No changes in mental status, recommend further evaluation with CT head w/o contrast in ED.    Twana First Paulina Fusi, DO of [facility] 04/10/2014, 5:10 AM

## 2014-04-15 ENCOUNTER — Other Ambulatory Visit: Payer: Self-pay | Admitting: Family Medicine

## 2014-04-15 MED ORDER — LORAZEPAM 0.5 MG PO TABS: 0.5000 mg | ORAL_TABLET | Freq: Three times a day (TID) | ORAL | Status: DC | PRN

## 2014-04-29 ENCOUNTER — Encounter: Payer: Self-pay | Admitting: Pharmacist

## 2014-05-16 ENCOUNTER — Inpatient Hospital Stay (HOSPITAL_COMMUNITY)
Admission: EM | Admit: 2014-05-16 | Discharge: 2014-05-20 | DRG: 187 | Disposition: A | Discharge status: Skilled Nursing Facility | Payer: Medicare Other | Attending: Family Medicine | Admitting: Family Medicine

## 2014-05-16 ENCOUNTER — Emergency Department (HOSPITAL_COMMUNITY): Payer: Medicare Other

## 2014-05-16 ENCOUNTER — Encounter (HOSPITAL_COMMUNITY): Payer: Self-pay | Admitting: Emergency Medicine

## 2014-05-16 ENCOUNTER — Telehealth: Payer: Self-pay | Admitting: Emergency Medicine

## 2014-05-16 DIAGNOSIS — F329 Major depressive disorder, single episode, unspecified: Secondary | ICD-10-CM | POA: Diagnosis present

## 2014-05-16 DIAGNOSIS — I10 Essential (primary) hypertension: Secondary | ICD-10-CM

## 2014-05-16 DIAGNOSIS — F079 Unspecified personality and behavioral disorder due to known physiological condition: Secondary | ICD-10-CM

## 2014-05-16 DIAGNOSIS — H409 Unspecified glaucoma: Secondary | ICD-10-CM | POA: Diagnosis present

## 2014-05-16 DIAGNOSIS — Z8249 Family history of ischemic heart disease and other diseases of the circulatory system: Secondary | ICD-10-CM

## 2014-05-16 DIAGNOSIS — J90 Pleural effusion, not elsewhere classified: Principal | ICD-10-CM | POA: Diagnosis present

## 2014-05-16 DIAGNOSIS — F0280 Dementia in other diseases classified elsewhere without behavioral disturbance: Secondary | ICD-10-CM

## 2014-05-16 DIAGNOSIS — F3289 Other specified depressive episodes: Secondary | ICD-10-CM | POA: Diagnosis present

## 2014-05-16 DIAGNOSIS — I679 Cerebrovascular disease, unspecified: Secondary | ICD-10-CM

## 2014-05-16 DIAGNOSIS — Z96659 Presence of unspecified artificial knee joint: Secondary | ICD-10-CM

## 2014-05-16 DIAGNOSIS — Z66 Do not resuscitate: Secondary | ICD-10-CM | POA: Diagnosis present

## 2014-05-16 DIAGNOSIS — Z823 Family history of stroke: Secondary | ICD-10-CM

## 2014-05-16 DIAGNOSIS — F03918 Unspecified dementia, unspecified severity, with other behavioral disturbance: Secondary | ICD-10-CM | POA: Diagnosis present

## 2014-05-16 DIAGNOSIS — R0602 Shortness of breath: Secondary | ICD-10-CM

## 2014-05-16 DIAGNOSIS — Z593 Problems related to living in residential institution: Secondary | ICD-10-CM

## 2014-05-16 DIAGNOSIS — F0391 Unspecified dementia with behavioral disturbance: Secondary | ICD-10-CM | POA: Diagnosis present

## 2014-05-16 DIAGNOSIS — E119 Type 2 diabetes mellitus without complications: Secondary | ICD-10-CM | POA: Diagnosis present

## 2014-05-16 DIAGNOSIS — G3183 Dementia with Lewy bodies: Secondary | ICD-10-CM

## 2014-05-16 DIAGNOSIS — Z888 Allergy status to other drugs, medicaments and biological substances status: Secondary | ICD-10-CM

## 2014-05-16 DIAGNOSIS — J189 Pneumonia, unspecified organism: Secondary | ICD-10-CM

## 2014-05-16 DIAGNOSIS — Z7982 Long term (current) use of aspirin: Secondary | ICD-10-CM

## 2014-05-16 DIAGNOSIS — Z951 Presence of aortocoronary bypass graft: Secondary | ICD-10-CM

## 2014-05-16 DIAGNOSIS — I2510 Atherosclerotic heart disease of native coronary artery without angina pectoris: Secondary | ICD-10-CM

## 2014-05-16 DIAGNOSIS — M1990 Unspecified osteoarthritis, unspecified site: Secondary | ICD-10-CM | POA: Diagnosis present

## 2014-05-16 DIAGNOSIS — Z789 Other specified health status: Secondary | ICD-10-CM

## 2014-05-16 DIAGNOSIS — E785 Hyperlipidemia, unspecified: Secondary | ICD-10-CM | POA: Diagnosis present

## 2014-05-16 LAB — URINALYSIS, ROUTINE W REFLEX MICROSCOPIC
Bilirubin Urine: NEGATIVE
Glucose, UA: NEGATIVE mg/dL
Ketones, ur: NEGATIVE mg/dL
Leukocytes, UA: NEGATIVE
Nitrite: NEGATIVE
PH: 6.5 (ref 5.0–8.0)
Protein, ur: NEGATIVE mg/dL
SPECIFIC GRAVITY, URINE: 1.017 (ref 1.005–1.030)
UROBILINOGEN UA: 1 mg/dL (ref 0.0–1.0)

## 2014-05-16 LAB — CBC WITH DIFFERENTIAL/PLATELET
BASOS PCT: 0 % (ref 0–1)
Basophils Absolute: 0 10*3/uL (ref 0.0–0.1)
Eosinophils Absolute: 0 10*3/uL (ref 0.0–0.7)
Eosinophils Relative: 1 % (ref 0–5)
HEMATOCRIT: 39.6 % (ref 36.0–46.0)
HEMOGLOBIN: 13.4 g/dL (ref 12.0–15.0)
Lymphocytes Relative: 17 % (ref 12–46)
Lymphs Abs: 0.9 10*3/uL (ref 0.7–4.0)
MCH: 32 pg (ref 26.0–34.0)
MCHC: 33.8 g/dL (ref 30.0–36.0)
MCV: 94.5 fL (ref 78.0–100.0)
MONO ABS: 0.6 10*3/uL (ref 0.1–1.0)
MONOS PCT: 12 % (ref 3–12)
NEUTROS ABS: 3.6 10*3/uL (ref 1.7–7.7)
Neutrophils Relative %: 70 % (ref 43–77)
Platelets: 222 10*3/uL (ref 150–400)
RBC: 4.19 MIL/uL (ref 3.87–5.11)
RDW: 14.5 % (ref 11.5–15.5)
WBC: 5.1 10*3/uL (ref 4.0–10.5)

## 2014-05-16 LAB — BASIC METABOLIC PANEL
BUN: 12 mg/dL (ref 6–23)
CALCIUM: 9.4 mg/dL (ref 8.4–10.5)
CO2: 24 meq/L (ref 19–32)
Chloride: 101 mEq/L (ref 96–112)
Creatinine, Ser: 0.85 mg/dL (ref 0.50–1.10)
GFR calc Af Amer: 66 mL/min — ABNORMAL LOW (ref >90)
GFR, EST NON AFRICAN AMERICAN: 57 mL/min — AB (ref >90)
GLUCOSE: 107 mg/dL — AB (ref 70–99)
Potassium: 4.1 mEq/L (ref 3.7–5.3)
SODIUM: 139 meq/L (ref 137–147)

## 2014-05-16 LAB — I-STAT CG4 LACTIC ACID, ED: Lactic Acid, Venous: 1.3 mmol/L (ref 0.5–2.2)

## 2014-05-16 LAB — PRO B NATRIURETIC PEPTIDE: Pro B Natriuretic peptide (BNP): 1534 pg/mL — ABNORMAL HIGH (ref 0–450)

## 2014-05-16 LAB — URINE MICROSCOPIC-ADD ON

## 2014-05-16 LAB — I-STAT TROPONIN, ED: TROPONIN I, POC: 0.01 ng/mL (ref 0.00–0.08)

## 2014-05-16 MED ORDER — DEXTROSE 5 % IV SOLN
1.0000 g | Freq: Once | INTRAVENOUS | Status: AC
  Administered 2014-05-16: 1 g via INTRAVENOUS
  Filled 2014-05-16: qty 1

## 2014-05-16 MED ORDER — VANCOMYCIN HCL IN DEXTROSE 1-5 GM/200ML-% IV SOLN
1000.0000 mg | Freq: Once | INTRAVENOUS | Status: DC
  Administered 2014-05-17: 1000 mg via INTRAVENOUS
  Filled 2014-05-16: qty 200

## 2014-05-16 MED ORDER — SODIUM CHLORIDE 0.9 % IV SOLN
1000.0000 mL | INTRAVENOUS | Status: DC
  Administered 2014-05-16: 1000 mL via INTRAVENOUS

## 2014-05-16 MED ORDER — SODIUM CHLORIDE 0.9 % IV BOLUS (SEPSIS)
30.0000 mL/kg | Freq: Once | INTRAVENOUS | Status: AC
  Administered 2014-05-16: 1000 mL via INTRAVENOUS

## 2014-05-16 MED ORDER — ACETAMINOPHEN 325 MG PO TABS
650.0000 mg | ORAL_TABLET | Freq: Four times a day (QID) | ORAL | Status: DC | PRN
  Administered 2014-05-16: 650 mg via ORAL
  Filled 2014-05-16: qty 2

## 2014-05-16 NOTE — ED Notes (Signed)
Per EMS PT [facility] and poor O2 sats <90% on RA. VS WNL per EMS

## 2014-05-16 NOTE — ED Provider Notes (Signed)
CSN: [id number]     Arrival date & time 05/16/14  2000 History   First MD Initiated Contact with Patient 05/16/14 2001     Chief Complaint  Patient presents with  . Fever  . Shortness of Breath     (Consider location/radiation/quality/duration/timing/severity/associated sxs/prior Treatment) HPI Comments: Patient presents to the emergency department with chief complaint of shortness of breath, fever, and a new oxygen requirement. She was sent to the emergency department by her nursing home, who report that the patient's oxygen saturation was less than 90% today. They also reports a fever of 101-102. Patient reports having a small cough, and some shortness of breath. She denies any chest pain, abdominal pain, nausea, vomiting, diarrhea, constipation, or dysuria. She has not tried taking anything to alleviate her symptoms. There no aggravating or alleviating factors. She does not have a history of asthma or COPD. She does have a history of CAD, hypertension, hyperlipidemia, and diabetes.  The history is provided by the patient, the nursing home and medical records. No language interpreter was used.    Past Medical History  Diagnosis Date  . Hypertension   . Dementia     Possible Lewy body dementia  . Hyperlipidemia   . Diabetes   . CAD (coronary artery disease)   . Degenerative arthritis   . Gait disorder   . Glaucoma   . Dementia with Lewy bodies 03/27/2013   Past Surgical History  Procedure Laterality Date  . Abdominal hysterectomy      for uterine fibroids  . Coronary artery bypass graft    . Total knee arthroplasty Left   . Cataract extraction, bilateral     Family History  Problem Relation Age of Onset  . Stroke Father   . Cancer Brother   . Heart failure Brother    History  Substance Use Topics  . Smoking status: Never Smoker   . Smokeless tobacco: Never Used  . Alcohol Use: No   OB History   Grav Para Term Preterm Abortions TAB SAB Ect Mult Living                  Review of Systems  All other systems reviewed and are negative.     Allergies  Seroquel  Home Medications   Prior to Admission medications   Medication Sig Start Date End Date Taking? Authorizing Provider  acetaminophen (TYLENOL) 325 MG tablet Take 650 mg by mouth every 4 (four) hours as needed for pain.    Historical Provider, MD  amLODipine (NORVASC) 5 MG tablet Take 1 tablet (5 mg total) by mouth daily. 02/04/14   Lora Paula, MD  aspirin 81 MG tablet Take 81 mg by mouth daily.    Historical Provider, MD  atenolol (TENORMIN) 100 MG tablet Take 50 mg by mouth daily.    Historical Provider, MD  brimonidine-timolol (COMBIGAN) 0.2-0.5 % ophthalmic solution Place 1 drop into both eyes 2 (two) times daily. Wait 3-5 minutes between eye drops.    Historical Provider, MD  citalopram (CELEXA) 20 MG tablet Take 10 mg by mouth daily. 09/23/13   Josalyn C Funches, MD  docusate sodium (COLACE) 100 MG capsule Take 100 mg by mouth daily. as needed for constipation    Historical Provider, MD  latanoprost (XALATAN) 0.005 % ophthalmic solution Place 1 drop into both eyes at bedtime.     Historical Provider, MD  lisinopril (PRINIVIL,ZESTRIL) 40 MG tablet Take 0.5 tablets (20 mg total) by mouth daily. 12/17/13   Ramon Dredge  Laurin CoderWilliamson V, MD  LORazepam (ATIVAN) 0.5 MG tablet Take 1 tablet (0.5 mg total) by mouth every 8 (eight) hours as needed for anxiety. 04/15/14   Uvaldo RisingKyle J Fletke, MD  Multiple Vitamins-Minerals (OCUVITE PRESERVISION PO) Take 1 tablet by mouth daily.    Historical Provider, MD  simvastatin (ZOCOR) 40 MG tablet Take 40 mg by mouth every evening.     Historical Provider, MD  Skin Protectants, Misc. (BAZA PROTECT EX) Apply 1 application topically 3 (three) times daily.    Historical Provider, MD  traZODone (DESYREL) 50 MG tablet Take 75 mg by mouth at bedtime.     Historical Provider, MD  Vitamin D, Ergocalciferol, (DRISDOL) 50000 UNITS CAPS capsule Take 50,000 Units by mouth every 30 (thirty)  days.    Historical Provider, MD   SpO2 97% Physical Exam  Nursing note and vitals reviewed. Constitutional: She is oriented to person, place, and time. She appears well-developed and well-nourished.  HENT:  Head: Normocephalic and atraumatic.  Eyes: Conjunctivae and EOM are normal. Pupils are equal, round, and reactive to light.  Neck: Normal range of motion. Neck supple.  Cardiovascular: Normal rate and regular rhythm.  Exam reveals no gallop and no friction rub.   No murmur heard. Pulmonary/Chest: Effort normal and breath sounds normal. No respiratory distress. She has no wheezes. She has no rales. She exhibits no tenderness.  Crackles heard in the right lung  Abdominal: Soft. Bowel sounds are normal. She exhibits no distension and no mass. There is no tenderness. There is no rebound and no guarding.  Musculoskeletal: Normal range of motion. She exhibits no edema and no tenderness.  Neurological: She is alert and oriented to person, place, and time.  Skin: Skin is warm and dry.  Psychiatric: She has a normal mood and affect. Her behavior is normal. Judgment and thought content normal.    ED Course  Procedures (including critical care time) Results for orders placed during the hospital encounter of 05/16/14  CBC WITH DIFFERENTIAL      Result Value Ref Range   WBC 5.1  4.0 - 10.5 K/uL   RBC 4.19  3.87 - 5.11 MIL/uL   Hemoglobin 13.4  12.0 - 15.0 g/dL   HCT 45.439.6  09.836.0 - 11.946.0 %   MCV 94.5  78.0 - 100.0 fL   MCH 32.0  26.0 - 34.0 pg   MCHC 33.8  30.0 - 36.0 g/dL   RDW 14.714.5  82.911.5 - 56.215.5 %   Platelets 222  150 - 400 K/uL   Neutrophils Relative % 70  43 - 77 %   Neutro Abs 3.6  1.7 - 7.7 K/uL   Lymphocytes Relative 17  12 - 46 %   Lymphs Abs 0.9  0.7 - 4.0 K/uL   Monocytes Relative 12  3 - 12 %   Monocytes Absolute 0.6  0.1 - 1.0 K/uL   Eosinophils Relative 1  0 - 5 %   Eosinophils Absolute 0.0  0.0 - 0.7 K/uL   Basophils Relative 0  0 - 1 %   Basophils Absolute 0.0  0.0 - 0.1  K/uL  BASIC METABOLIC PANEL      Result Value Ref Range   Sodium 139  137 - 147 mEq/L   Potassium 4.1  3.7 - 5.3 mEq/L   Chloride 101  96 - 112 mEq/L   CO2 24  19 - 32 mEq/L   Glucose, Bld 107 (*) 70 - 99 mg/dL   BUN 12  6 -  23 mg/dL   Creatinine, Ser 1.610.85  0.50 - 1.10 mg/dL   Calcium 9.4  8.4 - 09.610.5 mg/dL   GFR calc non Af Amer 57 (*) >90 mL/min   GFR calc Af Amer 66 (*) >90 mL/min  PRO B NATRIURETIC PEPTIDE      Result Value Ref Range   Pro B Natriuretic peptide (BNP) 1534.0 (*) 0 - 450 pg/mL  URINALYSIS, ROUTINE W REFLEX MICROSCOPIC      Result Value Ref Range   Color, Urine YELLOW  YELLOW   APPearance TURBID (*) CLEAR   Specific Gravity, Urine 1.017  1.005 - 1.030   pH 6.5  5.0 - 8.0   Glucose, UA NEGATIVE  NEGATIVE mg/dL   Hgb urine dipstick TRACE (*) NEGATIVE   Bilirubin Urine NEGATIVE  NEGATIVE   Ketones, ur NEGATIVE  NEGATIVE mg/dL   Protein, ur NEGATIVE  NEGATIVE mg/dL   Urobilinogen, UA 1.0  0.0 - 1.0 mg/dL   Nitrite NEGATIVE  NEGATIVE   Leukocytes, UA NEGATIVE  NEGATIVE  URINE MICROSCOPIC-ADD ON      Result Value Ref Range   Squamous Epithelial / LPF RARE  RARE   RBC / HPF 0-2  <3 RBC/hpf   Bacteria, UA RARE  RARE  I-STAT TROPOININ, ED      Result Value Ref Range   Troponin i, poc 0.01  0.00 - 0.08 ng/mL   Comment 3           I-STAT CG4 LACTIC ACID, ED      Result Value Ref Range   Lactic Acid, Venous 1.30  0.5 - 2.2 mmol/L   Dg Chest 2 View  05/16/2014   CLINICAL DATA:  Chest pain  EXAM: CHEST  2 VIEW  COMPARISON:  04/01/2014  FINDINGS: There is chronic cardiomegaly. There has been a previous median sternotomy. There is chronic fracturing of the upper sternal wire. Unremarkable upper mediastinal contours for age.  Mild pulmonary hyperinflation. No consolidation, edema, effusion, or pneumothorax. Apparent blunting of the posterior costophrenic sulci appears stable from prior, favoring scar. Calcified pulmonary nodule again noted in the left mid lung. Diffuse  spondylotic endplate spurring.  IMPRESSION: No active cardiopulmonary disease.   Electronically Signed   By: Tiburcio PeaJonathan  Watts M.D.   On: 05/16/2014 21:52      EKG Interpretation   Date/Time:  Sunday May 16 2014 20:11:30 EDT Ventricular Rate:  77 PR Interval:  153 QRS Duration: 74 QT Interval:  411 QTC Calculation: 465 R Axis:   83 Text Interpretation:  Sinus rhythm Borderline right axis deviation  Confirmed by HORTON  MD, COURTNEY ([id number]) on 05/16/2014 8:20:31 PM      MDM   Final diagnoses:  HCAP (healthcare-associated pneumonia)    Patient with cough, shortness of breath, and fever from nursing home. Will check labs. Suspect pneumonia.  Chest x-ray is negative, however given symptoms, we'll treat and cover for HCAP. Will admit the patient to the hospital.  Patient seen by and discussed with Dr. Wilkie AyeHorton, who agrees with the plan.    Roxy Horsemanobert Browning, PA-C 05/16/14 2315

## 2014-05-16 NOTE — ED Notes (Signed)
 NS by PT weight

## 2014-05-16 NOTE — H&P (Signed)
[facility] Ad[facility] History and Physical Service Pager: [phone]  Patient name: Lacey LassoRosie W Mccarthy Medical record number: [id number] Date of birth: 03/06/1918 Age: 80 y.o. Gender: female  Primary Care Provider: Marikay AlarSonnenberg, Eric, MD Consultants: None Code Status: Limited, DNR, intubation OK; confirmed with granddaughter, Steward DroneBrenda, via phone.  Chief Complaint: Shortness of breath  Assessment and Plan: [name] is a 80 y.o. female presenting with HCAP. PMH is significant for hypertension, dementia, glaucoma  # Healthcare associated pneumonia: patient lives in nursing home. Clinically, fits picture with tachypnea, shortness of breath, fever and oxygen requirement. No increased WBC and has normal x-ray. Lung exam not very significant. Shortness of breath not associated with any chest pain so will not pursue cardiac cause. Does not currently meet sepsis criteria.  Pro-BNP elevated, but no history of CHF and is not clinically volume overloaded.  No leg edema or erythema or tachycardia to suggest DVT/PE.  - Admit to inpatient, med-surg, attending Dr. Lum BabeEniola - Vancomycin and cefepime per pharmacy for HCAP - follow-up blood cultures - follow-up urine culture - Oxygen via [place] to keep O2 sats >92%; wean to room air as tolerated - Tylenol for fever and mild pain PRN - albuterol prn  # Hypertension: currently hypertensive. - Continue lisinopril 20mg  daily - Continue atenolol 50mg  daily - Continue amlodipine 5mg  daily - Monitor BPs  # Depression - continue citalopram  # Glaucoma - continue latanoprost and brimonidine-timolol  # Dementia: Patient with Lew-body dementia.  Appears to be at or near her baseline.   - will contact family to let her know she was admitted  FEN/GI: Heart healthy diet, 1/2NS @100ml /hr Prophylaxis: Heparin subq  Disposition: Admit to inpatient, med-surg  History of Present Illness: [name] is a 80 y.o. female presenting with  shortness of breath and fever. Patient not able to provide clear history due to being obtunded. Per report, patient presented from her nursing home with fever up to 101.2, agitation, tachypnea and hypoxemia to 82%. There was concern for pneumonia and patient sent to ED for evaluation. Patient reports no shortness of breath or chest pain. No abdominal pain, nausea or vomiting. She states she's in the ED to get a checkup.  Reports some decreased appetite.  Upon arrival to the ED, chest x-ray showed no signs of pneumonia and patient had a temperature up to 100.9, requiring 2L [place] to keep O2 sats at around 90%. Blood cultures and urine cultures obtained. Patient started on vancomycin and cefepime to treat HCAP.  Review Of Systems: Per HPI with the following additions: None Otherwise 12 point review of systems was performed and was unremarkable.  Patient Active Problem List   Diagnosis Date Noted  . PNA (pneumonia) 04/03/2014  . Glaucoma 12/09/2013  . Nursing home resident 07/29/2013  . Depression 07/29/2013  . Dementia with Lewy bodies 03/27/2013  . Other vitamin B12 deficiency anemia 10/30/2012  . Abnormality of gait 10/30/2012  . 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 HYPERLIPIDEMIA 10/20/2009  . HYPERTENSION 10/20/2009  . CAD 10/20/2009  . CEREBROVASCULAR DISEASE 10/20/2009   Past Medical History: Past Medical History  Diagnosis Date  . Hypertension   . Dementia     Possible Lewy body dementia  . Hyperlipidemia   . Diabetes   . CAD (coronary artery disease)   . Degenerative arthritis   . Gait disorder   . Glaucoma   . Dementia with Lewy bodies 03/27/2013   Past Surgical History: Past  Surgical History  Procedure Laterality Date  . Abdominal hysterectomy      for uterine fibroids  . Coronary artery bypass graft    . Total knee arthroplasty Left   . Cataract extraction, bilateral     Social History: History  Substance Use Topics   . Smoking status: Never Smoker   . Smokeless tobacco: Never Used  . Alcohol Use: No   Additional social history: None  Please also refer to relevant sections of EMR.  Family History: Family History  Problem Relation Age of Onset  . Stroke Father   . Cancer Brother   . Heart failure Brother    Allergies and Medications: Allergies  Allergen Reactions  . Seroquel [Quetiapine Fumarate] Other (See Comments)    Unknown    No current facility-administered medications on file prior to encounter.   Current Outpatient Prescriptions on File Prior to Encounter  Medication Sig Dispense Refill  . acetaminophen (TYLENOL) 325 MG tablet Take 650 mg by mouth every 4 (four) hours as needed for pain.      Marland Kitchen amLODipine (NORVASC) 5 MG tablet Take 1 tablet (5 mg total) by mouth daily.  90 tablet  3  . aspirin 81 MG tablet Take 81 mg by mouth daily.      Marland Kitchen atenolol (TENORMIN) 100 MG tablet Take 50 mg by mouth daily.      . brimonidine-timolol (COMBIGAN) 0.2-0.5 % ophthalmic solution Place 1 drop into both eyes 2 (two) times daily. Wait 3-5 minutes between eye drops.      . citalopram (CELEXA) 20 MG tablet Take 10 mg by mouth daily.      Marland Kitchen docusate sodium (COLACE) 100 MG capsule Take 100 mg by mouth daily. as needed for constipation      . latanoprost (XALATAN) 0.005 % ophthalmic solution Place 1 drop into both eyes at bedtime.       Marland Kitchen lisinopril (PRINIVIL,ZESTRIL) 40 MG tablet Take 0.5 tablets (20 mg total) by mouth daily.  30 tablet  0  . LORazepam (ATIVAN) 0.5 MG tablet Take 1 tablet (0.5 mg total) by mouth every 8 (eight) hours as needed for anxiety.  30 tablet  0  . Multiple Vitamins-Minerals (OCUVITE PRESERVISION PO) Take 1 tablet by mouth daily.      . simvastatin (ZOCOR) 40 MG tablet Take 40 mg by mouth every evening.       . traZODone (DESYREL) 50 MG tablet Take 75 mg by mouth at bedtime.       . Vitamin D, Ergocalciferol, (DRISDOL) 50000 UNITS CAPS capsule Take 50,000 Units by mouth every 30  (thirty) days.        Objective: BP 167/51  Pulse 74  Temp(Src) 98.5 F (36.9 C) (Oral)  Resp 22  SpO2 92%  Exam: General: Laying in bed, sleeping, easily arousable but kept falling asleep HEENT: Right pupil is about 5mm and not reactive to light. Left pupil is 3mm, minimally reactive. Cardiovascular: Regular rate and rhythm, no murmur, 2+ radial and dorsalis pedis pulses Respiratory: Mild crackles at left lung base, mildly decreased breath sounds at bases, mild suprasternal retractions; audible wheezing while sleeping, resolves on waking Abdomen: Soft, non-tender, non-distended Extremities: No calf tenderness, no edema or erythema Skin: Warm, well perfused Neuro: Alert, oriented to person. Knows she is in a hospital but did not know which one.  Labs and Imaging: CBC BMET   Recent Labs Lab 05/16/14 2013  WBC 5.1  HGB 13.4  HCT 39.6  PLT 222  Recent Labs Lab 05/16/14 2013  NA 139  K 4.1  CL 101  CO2 24  BUN 12  CREATININE 0.85  GLUCOSE 107*  CALCIUM 9.4     Pro-BNP: 1534 i-stat trop: 0.01 i-stat lactic acid: 1.30  UA: trace hgb, otherwise normal Blood Cx: pending Urine Cx: pending  Dg Chest 2 View  05/16/2014   CLINICAL DATA:  Chest pain  EXAM: CHEST  2 VIEW  COMPARISON:  04/01/2014  FINDINGS: There is chronic cardiomegaly. There has been a previous median sternotomy. There is chronic fracturing of the upper sternal wire. Unremarkable upper mediastinal contours for age.  Mild pulmonary hyperinflation. No consolidation, edema, effusion, or pneumothorax. Apparent blunting of the posterior costophrenic sulci appears stable from prior, favoring scar. Calcified pulmonary nodule again noted in the left mid lung. Diffuse spondylotic endplate spurring.  IMPRESSION: No active cardiopulmonary disease.   Electronically Signed   By: Tiburcio PeaJonathan  Watts M.D.   On: 05/16/2014 21:52    Jacquelin Hawkingalph Nettey, MD 05/16/2014, 11:27 PM PGY-1, [facility] FPTS Intern pager:  [phone], text pages welcome  PGY-3 Addendum I have seen and examined this patient.  I agree with the above note with my edits in pink.  HONIG, ERIN 05/17/2014, 6:32 AM

## 2014-05-16 NOTE — ED Notes (Signed)
MD at bedside. 

## 2014-05-16 NOTE — Telephone Encounter (Signed)
Emergency Line Call [place], [facility], called regarding Ms. [name].  She was agitate this evening and found to be tachypneic, hypoxic to 82%, hypertensive around 200/90, and febrile to 101.2.  I spoke with the granddaughter [name]([name]) over the phone who agrees with transfer to the ED for further workup.  Additionally, Lacey DroneBrenda confirms that Ms. [name] is a limited code.  They do not want chest compressions but are okay with intubation.  After work up in the ED, I anticipate that she will require admission.

## 2014-05-17 ENCOUNTER — Encounter (HOSPITAL_COMMUNITY): Payer: Self-pay | Admitting: *Deleted

## 2014-05-17 ENCOUNTER — Inpatient Hospital Stay (HOSPITAL_COMMUNITY): Payer: Medicare Other

## 2014-05-17 DIAGNOSIS — I369 Nonrheumatic tricuspid valve disorder, unspecified: Secondary | ICD-10-CM

## 2014-05-17 DIAGNOSIS — Z593 Problems related to living in residential institution: Secondary | ICD-10-CM

## 2014-05-17 DIAGNOSIS — F0280 Dementia in other diseases classified elsewhere without behavioral disturbance: Secondary | ICD-10-CM

## 2014-05-17 DIAGNOSIS — G3183 Dementia with Lewy bodies: Secondary | ICD-10-CM

## 2014-05-17 DIAGNOSIS — R0602 Shortness of breath: Secondary | ICD-10-CM

## 2014-05-17 DIAGNOSIS — F079 Unspecified personality and behavioral disorder due to known physiological condition: Secondary | ICD-10-CM

## 2014-05-17 DIAGNOSIS — I10 Essential (primary) hypertension: Secondary | ICD-10-CM

## 2014-05-17 DIAGNOSIS — I679 Cerebrovascular disease, unspecified: Secondary | ICD-10-CM

## 2014-05-17 DIAGNOSIS — J189 Pneumonia, unspecified organism: Secondary | ICD-10-CM

## 2014-05-17 LAB — CBC
HEMATOCRIT: 33.2 % — AB (ref 36.0–46.0)
Hemoglobin: 11.3 g/dL — ABNORMAL LOW (ref 12.0–15.0)
MCH: 32.2 pg (ref 26.0–34.0)
MCHC: 34 g/dL (ref 30.0–36.0)
MCV: 94.6 fL (ref 78.0–100.0)
PLATELETS: 179 10*3/uL (ref 150–400)
RBC: 3.51 MIL/uL — ABNORMAL LOW (ref 3.87–5.11)
RDW: 14.6 % (ref 11.5–15.5)
WBC: 4.8 10*3/uL (ref 4.0–10.5)

## 2014-05-17 LAB — BASIC METABOLIC PANEL
BUN: 9 mg/dL (ref 6–23)
CALCIUM: 8.5 mg/dL (ref 8.4–10.5)
CHLORIDE: 106 meq/L (ref 96–112)
CO2: 22 mEq/L (ref 19–32)
Creatinine, Ser: 0.71 mg/dL (ref 0.50–1.10)
GFR calc non Af Amer: 72 mL/min — ABNORMAL LOW (ref >90)
GFR, EST AFRICAN AMERICAN: 83 mL/min — AB (ref >90)
Glucose, Bld: 80 mg/dL (ref 70–99)
Potassium: 3.8 mEq/L (ref 3.7–5.3)
Sodium: 139 mEq/L (ref 137–147)

## 2014-05-17 LAB — D-DIMER, QUANTITATIVE (NOT AT ARMC): D DIMER QUANT: 1.73 ug{FEU}/mL — AB (ref 0.00–0.48)

## 2014-05-17 LAB — MRSA PCR SCREENING: MRSA by PCR: NEGATIVE

## 2014-05-17 MED ORDER — AMLODIPINE BESYLATE 5 MG PO TABS
5.0000 mg | ORAL_TABLET | Freq: Every day | ORAL | Status: DC
  Administered 2014-05-17 – 2014-05-20 (×3): 5 mg via ORAL
  Filled 2014-05-17 (×4): qty 1

## 2014-05-17 MED ORDER — ATENOLOL 50 MG PO TABS
50.0000 mg | ORAL_TABLET | Freq: Every day | ORAL | Status: DC
  Administered 2014-05-17 – 2014-05-20 (×3): 50 mg via ORAL
  Filled 2014-05-17 (×4): qty 1

## 2014-05-17 MED ORDER — HEPARIN SODIUM (PORCINE) 5000 UNIT/ML IJ SOLN
5000.0000 [IU] | Freq: Three times a day (TID) | INTRAMUSCULAR | Status: DC
  Administered 2014-05-17 – 2014-05-18 (×3): 5000 [IU] via SUBCUTANEOUS
  Filled 2014-05-17 (×12): qty 1

## 2014-05-17 MED ORDER — DOCUSATE SODIUM 100 MG PO CAPS: 100.0000 mg | ORAL_CAPSULE | Freq: Every day | ORAL | Status: DC | PRN

## 2014-05-17 MED ORDER — ACETAMINOPHEN 325 MG PO TABS
650.0000 mg | ORAL_TABLET | Freq: Four times a day (QID) | ORAL | Status: DC | PRN
  Filled 2014-05-17: qty 2

## 2014-05-17 MED ORDER — VITAMIN D (ERGOCALCIFEROL) 1.25 MG (50000 UNIT) PO CAPS
50000.0000 [IU] | ORAL_CAPSULE | ORAL | Status: DC
  Filled 2014-05-17: qty 1

## 2014-05-17 MED ORDER — LISINOPRIL 20 MG PO TABS
20.0000 mg | ORAL_TABLET | Freq: Every day | ORAL | Status: DC
  Administered 2014-05-17 – 2014-05-20 (×3): 20 mg via ORAL
  Filled 2014-05-17 (×4): qty 1

## 2014-05-17 MED ORDER — BISACODYL 5 MG PO TBEC: 10.0000 mg | DELAYED_RELEASE_TABLET | Freq: Every day | ORAL | Status: DC | PRN

## 2014-05-17 MED ORDER — SIMVASTATIN 40 MG PO TABS
40.0000 mg | ORAL_TABLET | Freq: Every evening | ORAL | Status: DC
  Filled 2014-05-17: qty 1

## 2014-05-17 MED ORDER — SODIUM CHLORIDE 0.45 % IV SOLN
INTRAVENOUS | Status: DC
  Administered 2014-05-17 (×2): via INTRAVENOUS
  Administered 2014-05-18: 1000 mL via INTRAVENOUS
  Administered 2014-05-19: 05:00:00 via INTRAVENOUS

## 2014-05-17 MED ORDER — ALBUTEROL SULFATE (2.5 MG/3ML) 0.083% IN NEBU: 2.5000 mg | INHALATION_SOLUTION | RESPIRATORY_TRACT | Status: DC | PRN

## 2014-05-17 MED ORDER — BRIMONIDINE TARTRATE 0.2 % OP SOLN
1.0000 [drp] | Freq: Two times a day (BID) | OPHTHALMIC | Status: DC
  Administered 2014-05-17 – 2014-05-20 (×6): 1 [drp] via OPHTHALMIC
  Filled 2014-05-17 (×2): qty 5

## 2014-05-17 MED ORDER — ASPIRIN EC 81 MG PO TBEC
81.0000 mg | DELAYED_RELEASE_TABLET | Freq: Every day | ORAL | Status: DC
  Administered 2014-05-17 – 2014-05-20 (×3): 81 mg via ORAL
  Filled 2014-05-17 (×4): qty 1

## 2014-05-17 MED ORDER — IOHEXOL 350 MG/ML SOLN
80.0000 mL | Freq: Once | INTRAVENOUS | Status: AC | PRN
  Administered 2014-05-17: 80 mL via INTRAVENOUS

## 2014-05-17 MED ORDER — LORAZEPAM 0.5 MG PO TABS
0.5000 mg | ORAL_TABLET | Freq: Three times a day (TID) | ORAL | Status: DC | PRN
  Administered 2014-05-17 – 2014-05-19 (×2): 0.5 mg via ORAL
  Filled 2014-05-17 (×2): qty 1

## 2014-05-17 MED ORDER — ACETAMINOPHEN 650 MG RE SUPP: 650.0000 mg | Freq: Four times a day (QID) | RECTAL | Status: DC | PRN

## 2014-05-17 MED ORDER — LATANOPROST 0.005 % OP SOLN
1.0000 [drp] | Freq: Every day | OPHTHALMIC | Status: DC
  Administered 2014-05-17: 1 [drp] via OPHTHALMIC
  Filled 2014-05-17 (×2): qty 2.5

## 2014-05-17 MED ORDER — VANCOMYCIN HCL IN DEXTROSE 750-5 MG/150ML-% IV SOLN
750.0000 mg | INTRAVENOUS | Status: DC
  Administered 2014-05-17: 750 mg via INTRAVENOUS
  Filled 2014-05-17 (×2): qty 150

## 2014-05-17 MED ORDER — TRAZODONE 25 MG HALF TABLET
75.0000 mg | ORAL_TABLET | Freq: Every evening | ORAL | Status: DC | PRN
  Filled 2014-05-17: qty 1

## 2014-05-17 MED ORDER — TIMOLOL MALEATE 0.5 % OP SOLN
1.0000 [drp] | Freq: Two times a day (BID) | OPHTHALMIC | Status: DC
  Administered 2014-05-17 – 2014-05-20 (×5): 1 [drp] via OPHTHALMIC
  Filled 2014-05-17 (×2): qty 5

## 2014-05-17 MED ORDER — CITALOPRAM HYDROBROMIDE 10 MG PO TABS
10.0000 mg | ORAL_TABLET | Freq: Every day | ORAL | Status: DC
  Administered 2014-05-17 – 2014-05-20 (×3): 10 mg via ORAL
  Filled 2014-05-17 (×4): qty 1

## 2014-05-17 MED ORDER — DEXTROSE 5 % IV SOLN
1.0000 g | INTRAVENOUS | Status: DC
  Administered 2014-05-18: 1 g via INTRAVENOUS
  Filled 2014-05-17 (×2): qty 1

## 2014-05-17 MED ORDER — ATORVASTATIN CALCIUM 20 MG PO TABS
20.0000 mg | ORAL_TABLET | Freq: Every day | ORAL | Status: DC
  Administered 2014-05-17 – 2014-05-18 (×2): 20 mg via ORAL
  Filled 2014-05-17 (×4): qty 1

## 2014-05-17 MED ORDER — BRIMONIDINE TARTRATE-TIMOLOL 0.2-0.5 % OP SOLN: 1.0000 [drp] | Freq: Two times a day (BID) | OPHTHALMIC | Status: DC

## 2014-05-17 NOTE — H&P (Signed)
FMTS ATTENDING  NOTE Kehinde Eniola,MD I  have seen and examined this patient, reviewed their chart. I have discussed this patient with the resident. I agree with the resident's findings, assessment and care plan.  80 Y/O F with PMX of HTN,HLD, CAD S/P CABG, Lewy bodies dementia brought in from the NH for SOB, and fever. Patient however denies any concern related to breathing but mentioned she is having right hip pain, she stated she fell couple of times which caused her pain. She denies chest pain,no palpitations, no GI symptoms. Patient was febrile on admission.  No current facility-administered medications on file prior to encounter.   Current Outpatient Prescriptions on File Prior to Encounter  Medication Sig Dispense Refill  . acetaminophen (TYLENOL) 325 MG tablet Take 650 mg by mouth every 4 (four) hours as needed for pain.      Marland Kitchen. amLODipine (NORVASC) 5 MG tablet Take 1 tablet (5 mg total) by mouth daily.  90 tablet  3  . aspirin 81 MG tablet Take 81 mg by mouth daily.      Marland Kitchen. atenolol (TENORMIN) 100 MG tablet Take 50 mg by mouth daily.      . brimonidine-timolol (COMBIGAN) 0.2-0.5 % ophthalmic solution Place 1 drop into both eyes 2 (two) times daily. Wait 3-5 minutes between eye drops.      . citalopram (CELEXA) 20 MG tablet Take 10 mg by mouth daily.      Marland Kitchen. docusate sodium (COLACE) 100 MG capsule Take 100 mg by mouth daily. as needed for constipation      . latanoprost (XALATAN) 0.005 % ophthalmic solution Place 1 drop into both eyes at bedtime.       Marland Kitchen. lisinopril (PRINIVIL,ZESTRIL) 40 MG tablet Take 0.5 tablets (20 mg total) by mouth daily.  30 tablet  0  . LORazepam (ATIVAN) 0.5 MG tablet Take 1 tablet (0.5 mg total) by mouth every 8 (eight) hours as needed for anxiety.  30 tablet  0  . Multiple Vitamins-Minerals (OCUVITE PRESERVISION PO) Take 1 tablet by mouth daily.      . simvastatin (ZOCOR) 40 MG tablet Take 40 mg by mouth every evening.       . traZODone (DESYREL) 50 MG tablet  Take 75 mg by mouth at bedtime.       . Vitamin D, Ergocalciferol, (DRISDOL) 50000 UNITS CAPS capsule Take 50,000 Units by mouth every 30 (thirty) days.       Past Medical History  Diagnosis Date  . Hypertension   . Dementia     Possible Lewy body dementia  . Hyperlipidemia   . Diabetes   . CAD (coronary artery disease)   . Degenerative arthritis   . Gait disorder   . Glaucoma   . Dementia with Lewy bodies 03/27/2013   Filed Vitals:   05/16/14 2315 05/16/14 2330 05/17/14 0103 05/17/14 0618  BP: 152/58 154/53 169/68 151/55  Pulse: 73 76 66 69  Temp:   97.9 F (36.6 C) 98.2 F (36.8 C)  TempSrc:   Oral Oral  Resp: 23 24 22 18   Height:   5\' 2"  (1.575 m)   Weight:   139 lb 12.4 oz (63.4 kg)   SpO2: 90% 90% 98% 96%   Exam: Neuro: Awake and alert, not oriented to time,place or person ( Element of dementia). Gen: Calm in bed, not in distress. Resp: Air entry reduced by equal B/L with no wheezing but faint basal crackles more on the right. Heart: S1 S2 normal, no murmur,  RRR. Abdomen: Soft, NT/ND, bowel sound normoactive. Ext: No edema.No tenderness on palpation and rotation of her right hip.  A/P: 80 Y/O F with   SOB: HCAP vs CHF.            Chest xray reviewed and was not suggestive of an infiltrate or pulmonary edema.            She has been afebrile for the last few hrs after admission.            O2 Sat on 2 L O2 is between 91-92%, uncertain what her previous O2 requirement status was or if this is new.            I agree with A/B coverage and urine and blood culture due to fever.            I recommended obtaining ECHO due to elevated ProBNP with hypoxemia although not currently fluid overload.            No prior ECHO report available for review.            Tylenol prn fever.            Monitor O2 requirement and replace as needed.  Hip pain:Likely arthritis.               If she continues to c/o pain to consider xray hip.               In the interim she may get  Tylenol prn pain.  HTN/HLD,CAD: Review home medication regimen and continue if appropriate.

## 2014-05-17 NOTE — Progress Notes (Signed)
Pt BP is elevated. Pt is very agitated and fiesty. Would not let me take BP again. Jovita Gamma/gave ativan. Will try again and monitor closely.

## 2014-05-17 NOTE — Progress Notes (Signed)
Echocardiogram 2D Echocardiogram has been performed.  [name], [name] 05/17/2014, 4:27 PM

## 2014-05-17 NOTE — ED Notes (Signed)
Report called to Vinnie LangtonGretchen, Charity fundraiserN. [address].

## 2014-05-17 NOTE — Progress Notes (Signed)
CALL PAGER [phone] for any questions or notifications regarding this patient   FMTS Attending Daily Note: Sara Neal MD  Attending pager:[phone]  office [phone]  I  have seen and examined this patient, reviewed their chart. I have discussed this patient with the resident. I agree with the resident's findings, assessment and care plan. 

## 2014-05-17 NOTE — Progress Notes (Signed)
Pt arrived on the unit alert but confused on R/A with a saturated diaper. Skin is intact.Blanchable red. Sats were 80%. Nasal canula placed with 02 @ 4L. Encouraged and educated about deep breathing. Sats increased to 98%. Weened pt to 2L t/o night, continuous pulse ox states 94% currently. An audible wheeze was noted t/o the night. Pt oriented to callbell. Placed in reach. Bed alarm. Pt fell directly to sleep. Monitored closely. No s/s of distress.

## 2014-05-17 NOTE — Progress Notes (Signed)
ANTIBIOTIC CONSULT NOTE - INITIAL  Pharmacy Consult for Vancomycin/Cefepime  Indication: r/o HCAP  Allergies  Allergen Reactions  . Seroquel [Quetiapine Fumarate] Other (See Comments)    Unknown     Patient Measurements: Height: 5\' 2"  (157.5 cm) Weight: 139 lb 12.4 oz (63.4 kg) (different bed) IBW/kg (Calculated) : 50.1  Vital Signs: Temp: 97.9 F (36.6 C) (06/29 0103) Temp src: Oral (06/29 0103) BP: 169/68 mmHg (06/29 0103) Pulse Rate: 66 (06/29 0103)  Labs:  Recent Labs  05/16/14 2013  WBC 5.1  HGB 13.4  PLT 222  CREATININE 0.85   Estimated Creatinine Clearance: 35.4 ml/min (by C-G formula based on Cr of 0.85).  Medical History: Past Medical History  Diagnosis Date  . Hypertension   . Dementia     Possible Lewy body dementia  . Hyperlipidemia   . Diabetes   . CAD (coronary artery disease)   . Degenerative arthritis   . Gait disorder   . Glaucoma   . Dementia with Lewy bodies 03/27/2013   Assessment: 80 y/o F here with fever/shortness of breath, to start broad spectrum antibiotics for r/o HCAP. WBC wnl, renal function appropriate for age, other labs as above.   Goal of Therapy:  Vancomycin trough level 15-20 mcg/ml  Plan:  -Vancomycin 750 mg IV q24h -Cefepime 1g IV q24h -Trend WBC, temp, renal function  -Drug levels as indicated   Abran DukeLedford, James 05/17/2014,1:10 AM

## 2014-05-17 NOTE — Progress Notes (Signed)
Family Medicine Teaching Service Daily Progress Note Intern Pager: [phone]  Patient name: Lacey LassoRosie Mccarthy Giannini Medical record number: [id number] Date of birth: 01/19/1918 Age: 80 y.o. Gender: female  Primary Care Vaneza Pickart: Marikay AlarSonnenberg, Eric, MD Consultants: None Code Status: Limited: DNR, intubation OK  Pt Overview and Major Events to Date:  6/28: patient admitted, vanc and cefepime started  Assessment and Plan: Daiva EvesRosie Mccarthy [name] is a 80 y.o. female presenting with HCAP. PMH is significant for hypertension, dementia, glaucoma   # Healthcare associated pneumonia: on antibiotics and requiring oxygen - Admit to inpatient, med-surg, attending Dr. Lum BabeEniola  - Vancomycin and cefepime per pharmacy for HCAP (6/28>> - follow-up blood cultures (6/28): pending - follow-up urine culture (6/28): pending - Oxygen via [place] to keep O2 sats >92%; wean to room air as tolerated - Tylenol for fever and mild pain PRN   # Shortness of breath: elevated proBNP - echocardiogram  # Hypertension: currently hypertensive.  - Continue lisinopril 20mg  daily  - Continue atenolol 50mg  daily  - Continue amlodipine 5mg  daily  - Monitor BPs   # Depression  - continue citalopram   # Glaucoma  - continue latanoprost and brimonidine-timolol   FEN/GI: Heart healthy diet, 1/2NS @100ml /hr  Prophylaxis: Heparin subq  Disposition: SNF pending transition to PO antibiotics  Subjective:  Patient has no complaints today. No chest pain, shortness of breath or abdominal pain. Currently on 1.5L of oxygen  Objective: Temp:  [97.9 F (36.6 C)-100.9 F (38.3 C)] 97.9 F (36.6 C) (06/29 0103) Pulse Rate:  [66-83] 66 (06/29 0103) Resp:  [18-30] 22 (06/29 0103) BP: (115-183)/(44-97) 169/68 mmHg (06/29 0103) SpO2:  [90 %-99 %] 98 % (06/29 0103) Weight:  [139 lb 12.4 oz (63.4 kg)] 139 lb 12.4 oz (63.4 kg) (06/29 0103)  Physical Exam: General: Laying in bed, in no acute distress, pleasant Cardiovascular: Regular rate and rhythm, no  murmurs, 2+ distal pulses Respiratory: Bibasilar crackles, audible wheeze with transmitted upper airway sounds, very mild suprasternal retractions Abdomen: Soft, non-tender, non-distended Extremities: Calfs non-tender, no edema  Laboratory:  Recent Labs Lab 05/16/14 2013 05/17/14 0400  WBC 5.1 4.8  HGB 13.4 11.3*  HCT 39.6 33.2*  PLT 222 179    Recent Labs Lab 05/16/14 2013 05/17/14 0400  NA 139 139  K 4.1 3.8  CL 101 106  CO2 24 22  BUN 12 9  CREATININE 0.85 0.71  CALCIUM 9.4 8.5  GLUCOSE 107* 80   Pro B Natriuretic peptide (BNP)  Date Value Ref Range Status  05/16/2014 1534.0* 0 - 450 pg/mL Final    Imaging/Diagnostic Tests: Dg Chest 2 View  05/16/2014   CLINICAL DATA:  Chest pain  EXAM: CHEST  2 VIEW  COMPARISON:  04/01/2014  FINDINGS: There is chronic cardiomegaly. There has been a previous median sternotomy. There is chronic fracturing of the upper sternal wire. Unremarkable upper mediastinal contours for age.  Mild pulmonary hyperinflation. No consolidation, edema, effusion, or pneumothorax. Apparent blunting of the posterior costophrenic sulci appears stable from prior, favoring scar. Calcified pulmonary nodule again noted in the left mid lung. Diffuse spondylotic endplate spurring.  IMPRESSION: No active cardiopulmonary disease.   Electronically Signed   By: Tiburcio PeaJonathan  Watts M.D.   On: 05/16/2014 21:52    Jacquelin Hawkingalph Nettey, MD 05/17/2014, 5:53 AM PGY-1, [facility] Family Medicine FPTS Intern pager: [phone], text pages welcome

## 2014-05-18 LAB — CBC
HCT: 35.8 % — ABNORMAL LOW (ref 36.0–46.0)
Hemoglobin: 12.3 g/dL (ref 12.0–15.0)
MCH: 32.6 pg (ref 26.0–34.0)
MCHC: 34.4 g/dL (ref 30.0–36.0)
MCV: 95 fL (ref 78.0–100.0)
PLATELETS: 176 10*3/uL (ref 150–400)
RBC: 3.77 MIL/uL — AB (ref 3.87–5.11)
RDW: 14.5 % (ref 11.5–15.5)
WBC: 4.1 10*3/uL (ref 4.0–10.5)

## 2014-05-18 LAB — URINE CULTURE
COLONY COUNT: NO GROWTH
CULTURE: NO GROWTH

## 2014-05-18 LAB — BASIC METABOLIC PANEL
BUN: 8 mg/dL (ref 6–23)
CHLORIDE: 104 meq/L (ref 96–112)
CO2: 22 mEq/L (ref 19–32)
Calcium: 9 mg/dL (ref 8.4–10.5)
Creatinine, Ser: 0.7 mg/dL (ref 0.50–1.10)
GFR calc Af Amer: 83 mL/min — ABNORMAL LOW (ref >90)
GFR calc non Af Amer: 72 mL/min — ABNORMAL LOW (ref >90)
Glucose, Bld: 73 mg/dL (ref 70–99)
Potassium: 3.7 mEq/L (ref 3.7–5.3)
SODIUM: 139 meq/L (ref 137–147)

## 2014-05-18 MED ORDER — FUROSEMIDE 10 MG/ML IJ SOLN
20.0000 mg | Freq: Once | INTRAMUSCULAR | Status: AC
  Administered 2014-05-18: 20 mg via INTRAVENOUS
  Filled 2014-05-18: qty 2

## 2014-05-18 MED ORDER — ENSURE COMPLETE PO LIQD
237.0000 mL | Freq: Two times a day (BID) | ORAL | Status: DC
  Administered 2014-05-18 – 2014-05-20 (×4): 237 mL via ORAL

## 2014-05-18 MED ORDER — LEVOFLOXACIN 500 MG PO TABS
500.0000 mg | ORAL_TABLET | Freq: Every day | ORAL | Status: DC
  Filled 2014-05-18: qty 1

## 2014-05-18 NOTE — Progress Notes (Signed)
CALL PAGER [phone] for any questions or notifications regarding this patient   FMTS Attending Daily Note: Sara Neal MD  Attending pager:[phone]  office [phone]  I  have seen and examined this patient, reviewed their chart. I have discussed this patient with the resident. I agree with the resident's findings, assessment and care plan. 

## 2014-05-18 NOTE — Progress Notes (Signed)
Family Medicine Teaching Service Daily Progress Note Intern Pager: [phone]  Patient name: [name] Medical record number: [id number] Date of birth: 03/05/1918 Age: 80 y.o. Gender: female  Primary Care Provider: Marikay AlarSonnenberg, Eric, MD Consultants: None Code Status: Limited: DNR, intubation OK  Pt Overview and Major Events to Date:  6/28: patient admitted, vanc and cefepime started  Assessment and Plan: [name] is a 80 y.o. female presenting with HCAP. PMH is significant for hypertension, dementia, glaucoma   # Healthcare associated pneumonia: on antibiotics and requiring oxygen. Back up to 3L with desats to 80s while off. - Vancomycin and cefepime per pharmacy for HCAP (6/28>> - follow-up blood cultures (6/28): No growth - follow-up urine culture (6/28): No growth - Oxygen via [place] to keep O2 sats >92%; wean to room air as tolerated - Tylenol for fever and mild pain PRN   # Shortness of breath: elevated proBNP. CTA negative for PE. Patient not endorsing symptoms from last encounter - oxygen as needed to keep sats >92%  # Hypertension: currently hypertensive.  - Continue lisinopril 20mg  daily  - Continue atenolol 50mg  daily  - Continue amlodipine 5mg  daily  - Monitor BPs   # Depression  - continue citalopram   # Glaucoma  - continue latanoprost and brimonidine-timolol   FEN/GI: Heart healthy diet, 1/2NS @100ml /hr  Prophylaxis: Heparin subq  Disposition: SNF pending transition to PO antibiotics  Subjective:  Patient not as responsive today since she was sleeping. Similar to when she presented to the hospital. Currently on 3L [place].  Objective: Temp:  [97.6 F (36.4 C)-99.4 F (37.4 C)] 97.9 F (36.6 C) (06/30 0604) Pulse Rate:  [55-74] 55 (06/30 0604) Resp:  [17-20] 17 (06/30 0604) BP: (144-172)/(54-103) 144/54 mmHg (06/30 0604) SpO2:  [85 %-97 %] 93 % (06/30 0604)  Physical Exam: General: Laying in bed sleeping, in no acute distress Cardiovascular: Regular  rate and rhythm, no murmurs, 2+ distal pulses Respiratory: Bibasilar crackles, no wheezing, moderate suprasternal retractions Abdomen: Soft, non-tender, non-distended Extremities: Calfs non-tender, no edema  Laboratory:  Recent Labs Lab 05/16/14 2013 05/17/14 0400  WBC 5.1 4.8  HGB 13.4 11.3*  HCT 39.6 33.2*  PLT 222 179    Recent Labs Lab 05/16/14 2013 05/17/14 0400  NA 139 139  K 4.1 3.8  CL 101 106  CO2 24 22  BUN 12 9  CREATININE 0.85 0.71  CALCIUM 9.4 8.5  GLUCOSE 107* 80   Pro B Natriuretic peptide (BNP)  Date Value Ref Range Status  05/16/2014 1534.0* 0 - 450 pg/mL Final    Imaging/Diagnostic Tests: Dg Chest 2 View  05/16/2014   CLINICAL DATA:  Chest pain  EXAM: CHEST  2 VIEW  COMPARISON:  04/01/2014  FINDINGS: There is chronic cardiomegaly. There has been a previous median sternotomy. There is chronic fracturing of the upper sternal wire. Unremarkable upper mediastinal contours for age.  Mild pulmonary hyperinflation. No consolidation, edema, effusion, or pneumothorax. Apparent blunting of the posterior costophrenic sulci appears stable from prior, favoring scar. Calcified pulmonary nodule again noted in the left mid lung. Diffuse spondylotic endplate spurring.  IMPRESSION: No active cardiopulmonary disease.   Electronically Signed   By: Tiburcio PeaJonathan  Watts M.D.   On: 05/16/2014 21:52   Ct Angio Chest Pe W/cm &/or Wo Cm  05/17/2014   CLINICAL DATA:  Fever and shortness of breath. Question pulmonary embolus.  EXAM: CT ANGIOGRAPHY CHEST WITH CONTRAST  TECHNIQUE: Multidetector CT imaging of the chest was performed using the standard protocol  during bolus administration of intravenous contrast. Multiplanar CT image reconstructions and MIPs were obtained to evaluate the vascular anatomy.  CONTRAST:  80 mL OMNIPAQUE IOHEXOL 350 MG/ML SOLN  COMPARISON:  PA and lateral chest 04/01/2014 and 05/16/2014.  FINDINGS: No pulmonary embolus is identified. The patient has small to moderate  bilateral pleural effusions. There is no pericardial effusion. Mild cardiomegaly is noted. Atherosclerotic vascular disease is seen. No axillary, hilar or mediastinal lymphadenopathy is identified. The lungs show only some mild compressive atelectasis. Incidentally imaged upper abdomen is unremarkable. No lytic or sclerotic bony lesion is identified.  Review of the MIP images confirms the above findings.  IMPRESSION: Negative for pulmonary embolus.  Small to moderate pleural effusions, larger on the right.  Mild cardiomegaly without edema.  Atherosclerosis.   Electronically Signed   By: Drusilla Kannerhomas  Dalessio M.D.   On: 05/17/2014 15:37    Jacquelin Hawkingalph Lashina Milles, MD 05/18/2014, 6:10 AM PGY-1, [facility] FPTS Intern pager: [phone], text pages welcome

## 2014-05-18 NOTE — Discharge Summary (Signed)
[facility]  Patient name: [name] Medical record number: [id number] Date of birth: 03/27/1918 Age: 80 y.o. Gender: female Date of Admission: 05/16/2014  Date of Discharge: 05/20/2014  Admitting Physician: Janit PaganKehinde Eniola, MD  Primary Care Kitiara Hintze: Marikay AlarSonnenberg, Eric, MD Consultants: None  Indication for Hospitalization: Shortness of Breath   Discharge Diagnoses/Problem List:  Healthcare associated pneumonia Shortness of breath Hypertension Depression Glaucoma Dementia with behavioral disturbances  Disposition: Discharge to SNF  Discharge Condition: Stable; 2L [place] to keep oxygen saturation about 92%  Discharge Exam: General: Sitting up in bed. In no acute distress  Cardiovascular: Regular rate and rhythm, no murmurs, 2+ distal pulses  Respiratory: Bibasilar crackles, no wheezing, moderate suprasternal retractions  Abdomen: Soft, non-tender, non-distended  Extremities: Calfs non-tender, no edema  Brief Hospital Course:   HPI: [name] is a 80 y.o. female presenting with shortness of breath and fever. Patient not able to provide clear history due to being obtunded. Per report, patient presented from her nursing home with fever up to 101.2, agitation, tachypnea and hypoxemia to 82%. There was concern for pneumonia and patient sent to ED for evaluation. Patient reports no shortness of breath or chest pain. No abdominal pain, nausea or vomiting. She states she's in the ED to get a checkup. Reports some decreased appetite.  Upon arrival to the ED, chest x-ray showed no signs of pneumonia and patient had a temperature up to 100.9, requiring 2L [place] to keep O2 sats at around 90%. Blood cultures and urine cultures obtained. Patient started on vancomycin and cefepime to treat HCAP.  SOB: shortness of breath with new O2 requirement and fever to 101. Chest x-ray with no pneumonia. Started on vancomycin and cefepime. Blood culture  and urine culture obtained and had no growth. Antibiotics transitioned to Levaquin PO x 1 dose but pt refused to take this.  She was afebrile x 72 hours, BCx and UCx NGTD and was stopped ABx as her shortness of breath was attributed to evidence of pleural effusion on x-ray. Echocardiogram obtained which showed EF of 55-60%. Patient given lasix 20mg  IV x1.  Improved prior to discharge   Hypertension: continued home lisinopril, atenolol and amlodipine  Depression: continued citalopram  Glaucoma: continued latanoprost and brimonidine-timolol  Dementia with behavioral disturbances: Received ativan PRN for agitation.  Issues for Follow Up:  1. F/U Final Cxs 2. F/U mental status   Significant Procedures:  1. Echocardiogram  Significant Labs and Imaging:   Recent Labs Lab 05/16/14 2013 05/17/14 0400 05/18/14 1039  WBC 5.1 4.8 4.1  HGB 13.4 11.3* 12.3  HCT 39.6 33.2* 35.8*  PLT 222 179 176    Recent Labs Lab 05/16/14 2013 05/17/14 0400 05/18/14 1039  NA 139 139 139  K 4.1 3.8 3.7  CL 101 106 104  CO2 24 22 22   GLUCOSE 107* 80 73  BUN 12 9 8   CREATININE 0.85 0.71 0.70  CALCIUM 9.4 8.5 9.0   Lab Results  Component Value Date   DDIMER 1.73* 05/17/2014    Dg Chest 2 View  05/16/2014   CLINICAL DATA:  Chest pain  EXAM: CHEST  2 VIEW  COMPARISON:  04/01/2014  FINDINGS: There is chronic cardiomegaly. There has been a previous median sternotomy. There is chronic fracturing of the upper sternal wire. Unremarkable upper mediastinal contours for age.  Mild pulmonary hyperinflation. No consolidation, edema, effusion, or pneumothorax. Apparent blunting of the posterior costophrenic sulci appears stable from prior, favoring scar. Calcified  pulmonary nodule again noted in the left mid lung. Diffuse spondylotic endplate spurring.  IMPRESSION: No active cardiopulmonary disease.   Electronically Signed   By: Tiburcio Pea M.D.   On: 05/16/2014 21:52   Ct Angio Chest Pe W/cm &/or Wo  Cm  05/17/2014   CLINICAL DATA:  Fever and shortness of breath. Question pulmonary embolus.  EXAM: CT ANGIOGRAPHY CHEST WITH CONTRAST  TECHNIQUE: Multidetector CT imaging of the chest was performed using the standard protocol during bolus administration of intravenous contrast. Multiplanar CT image reconstructions and MIPs were obtained to evaluate the vascular anatomy.  CONTRAST:  80 mL OMNIPAQUE IOHEXOL 350 MG/ML SOLN  COMPARISON:  PA and lateral chest 04/01/2014 and 05/16/2014.  FINDINGS: No pulmonary embolus is identified. The patient has small to moderate bilateral pleural effusions. There is no pericardial effusion. Mild cardiomegaly is noted. Atherosclerotic vascular disease is seen. No axillary, hilar or mediastinal lymphadenopathy is identified. The lungs show only some mild compressive atelectasis. Incidentally imaged upper abdomen is unremarkable. No lytic or sclerotic bony lesion is identified.  Review of the MIP images confirms the above findings.  IMPRESSION: Negative for pulmonary embolus.  Small to moderate pleural effusions, larger on the right.  Mild cardiomegaly without edema.  Atherosclerosis.   Electronically Signed   By: Drusilla Kanner M.D.   On: 05/17/2014 15:37    Results/Tests Pending at Time of Discharge: None   Discharge Medications:    Medication List         acetaminophen 325 MG tablet  Commonly known as:  TYLENOL  Take 650 mg by mouth every 4 (four) hours as needed for pain.     amLODipine 5 MG tablet  Commonly known as:  NORVASC  Take 1 tablet (5 mg total) by mouth daily.     aspirin 81 MG tablet  Take 81 mg by mouth daily.     atenolol 100 MG tablet  Commonly known as:  TENORMIN  Take 50 mg by mouth daily.     bisacodyl 5 MG EC tablet  Commonly known as:  DULCOLAX  Take 10 mg by mouth daily as needed for moderate constipation.     citalopram 20 MG tablet  Commonly known as:  CELEXA  Take 10 mg by mouth daily.     COMBIGAN 0.2-0.5 % ophthalmic  solution  Generic drug:  brimonidine-timolol  Place 1 drop into both eyes 2 (two) times daily. Wait 3-5 minutes between eye drops.     docusate sodium 100 MG capsule  Commonly known as:  COLACE  Take 100 mg by mouth daily. as needed for constipation     latanoprost 0.005 % ophthalmic solution  Commonly known as:  XALATAN  Place 1 drop into both eyes at bedtime.     lisinopril 40 MG tablet  Commonly known as:  PRINIVIL,ZESTRIL  Take 0.5 tablets (20 mg total) by mouth daily.     LORazepam 0.5 MG tablet  Commonly known as:  ATIVAN  Take 1 tablet (0.5 mg total) by mouth every 8 (eight) hours as needed for anxiety.     MILK OF MAGNESIA PO  Take 30 mLs by mouth as needed (for constipation).     OCUVITE PRESERVISION PO  Take 1 tablet by mouth daily.     simvastatin 40 MG tablet  Commonly known as:  ZOCOR  Take 40 mg by mouth every evening.     traZODone 50 MG tablet  Commonly known as:  DESYREL  Take 75 mg by mouth  at bedtime.     Vitamin D (Ergocalciferol) 50000 UNITS Caps capsule  Commonly known as:  DRISDOL  Take 50,000 Units by mouth every 30 (thirty) days.        Discharge Instructions: Please refer to Patient Instructions section of EMR for full details.  Patient was counseled important signs and symptoms that should prompt return to medical care, changes in medications, dietary instructions, activity restrictions, and follow up appointments.   Follow-Up Appointments:  Follow-up Information   Follow up with Marikay AlarSonnenberg, Eric, MD. (As needed)    Specialty:  Family Medicine   Contact information:   [address] [place] [address] [phone]       Follow-up Information   Follow up with Marikay AlarSonnenberg, Eric, MD. (As needed)    Specialty:  Family Medicine   Contact information:   [address] [place] [address] [phone]      Kathee DeltonMcKeag, Ian D 05/20/2014 12:58 PM

## 2014-05-18 NOTE — Progress Notes (Signed)
INITIAL NUTRITION ASSESSMENT  DOCUMENTATION CODES Per approved criteria  -Not Applicable   INTERVENTION: Add Ensure Complete po BID, each supplement provides 350 kcal and 13 grams of protein. RD to continue to follow nutrition care plan.  NUTRITION DIAGNOSIS: Inadequate oral intake related to AMS as evidenced by nursing report.   Goal: Intake to meet >90% of estimated nutrition needs.  Monitor:  weight trends, lab trends, I/O's, PO intake, supplement tolerance  Reason for Assessment: Malnutrition Screening Tool  80 y.o. female  Admitting Dx:  HCAP  ASSESSMENT: PMHx significant for HTN, dementia and glaucoma. Admitted with SOB and fever, pt also with decreased appetite. Work-up reveals HCAP.  Pt is currently ordered for Heart Healthy diet. Refused breakfast this morning, however per nurse tech, patient ate really well yesterday. Staff thinks that pt was just sleepy this morning and that was why she did not eat. Pt telling me that she did not refuse breakfast, as she is confused.  Pt would benefit from oral nutrition supplements to help with nutritional status.  Brief physical exam reveals that patient is without significant fat/muscle wasting.  Height: Ht Readings from Last 1 Encounters:  05/17/14 5\' 2"  (1.575 m)    Weight: Wt Readings from Last 1 Encounters:  05/17/14 139 lb 12.4 oz (63.4 kg)    Ideal Body Weight: 110 lb  % Ideal Body Weight: 126%  Wt Readings from Last 10 Encounters:  05/17/14 139 lb 12.4 oz (63.4 kg)  09/23/13 123 lb 9.6 oz (56.065 kg)  08/25/13 123 lb 9.6 oz (56.065 kg)  10/30/12 132 lb (59.875 kg)  11/01/10 141 lb (63.957 kg)    Usual Body Weight: n/a  % Usual Body Weight: n/a  BMI:  Body mass index is 25.56 kg/(m^2). Overweight  Estimated Nutritional Needs: Kcal: 1350 - 1500 Protein: 65 - 75 grams Fluid: approx 1.5 liters   Skin: intact  Diet Order: Cardiac  EDUCATION NEEDS: -No education needs identified at this  time   Intake/Output Summary (Last 24 hours) at 05/18/14 1054 Last data filed at 05/18/14 0857  Gross per 24 hour  Intake    260 ml  Output      0 ml  Net    260 ml    Last BM: 6/30  Labs:   Recent Labs Lab 05/16/14 2013 05/17/14 0400  NA 139 139  K 4.1 3.8  CL 101 106  CO2 24 22  BUN 12 9  CREATININE 0.85 0.71  CALCIUM 9.4 8.5  GLUCOSE 107* 80    CBG (last 3)  No results found for this basename: GLUCAP,  in the last 72 hours  Scheduled Meds: . amLODipine  5 mg Oral Daily  . aspirin EC  81 mg Oral Daily  . atenolol  50 mg Oral Daily  . atorvastatin  20 mg Oral q1800  . brimonidine  1 drop Both Eyes BID   And  . timolol  1 drop Both Eyes BID  . ceFEPime (MAXIPIME) IV  1 g Intravenous Q24H  . citalopram  10 mg Oral Daily  . furosemide  20 mg Intravenous Once  . heparin  5,000 Units Subcutaneous 3 times per day  . latanoprost  1 drop Both Eyes QHS  . lisinopril  20 mg Oral Daily  . vancomycin  750 mg Intravenous Q24H  . [START ON 05/19/2014] Vitamin D (Ergocalciferol)  50,000 Units Oral Q30 days    Continuous Infusions: . sodium chloride 100 mL/hr at 05/17/14 1021    Past  Medical History  Diagnosis Date  . Hypertension   . Dementia     Possible Lewy body dementia  . Hyperlipidemia   . Diabetes   . CAD (coronary artery disease)   . Degenerative arthritis   . Gait disorder   . Glaucoma   . Dementia with Lewy bodies 03/27/2013    Past Surgical History  Procedure Laterality Date  . Abdominal hysterectomy      for uterine fibroids  . Coronary artery bypass graft    . Total knee arthroplasty Left   . Cataract extraction, bilateral      Lacey MottoSamantha Worley MS, RD, LDN Inpatient Registered Dietitian Pager: [phone] After-hours pager: [phone]

## 2014-05-18 NOTE — Clinical Documentation Improvement (Signed)
  Per RN Note "Pt is very agitated and fiesty. Would not let me take BP again. Jovita Gamma/gave ativan." Patient with dementia. Please clarify current status to reflect severity of illness and risk of mortality. Thank you.  Possible Clinical Conditions? - Dementia with behavioral disturbances  - Other Condition__________________  Rollene Farehank You, Beverley FiedlerLaurie E Tuttle ,RN Clinical Documentation Specialist:  [phone]  [facility]- Health Information Management

## 2014-05-18 NOTE — ED Provider Notes (Signed)
Medical screening examination/treatment/procedure(s) were conducted as a shared visit with non-physician practitioner(s) and myself.  I personally evaluated the patient during the encounter.   EKG Interpretation   Date/Time:  Sunday May 16 2014 20:11:30 EDT Ventricular Rate:  77 PR Interval:  153 QRS Duration: 74 QT Interval:  411 QTC Calculation: 465 R Axis:   83 Text Interpretation:  Sinus rhythm Borderline right axis deviation  Confirmed by HORTON  MD, COURTNEY ([id number]) on 05/16/2014 8:20:31 PM      Patient presents with fever, shortness of breath, and new oxygen requirement. She lives in a nursing facility.  She is requiring supplemental oxygen to maintain oxygen saturations. She is otherwise nontoxic-appearing. She does have a fever. Lactate normal. Chest x-ray without evidence of pneumonia but given oxygen requirement and fever will cover for HCAP and  Admit to the hospital.  Shon Batonourtney F Horton, MD 05/18/14 2257

## 2014-05-19 DIAGNOSIS — I2510 Atherosclerotic heart disease of native coronary artery without angina pectoris: Secondary | ICD-10-CM

## 2014-05-19 MED ORDER — LEVOFLOXACIN 750 MG PO TABS
750.0000 mg | ORAL_TABLET | ORAL | Status: DC
  Filled 2014-05-19: qty 1

## 2014-05-19 NOTE — Progress Notes (Signed)
Paged Dr. Budd PalmerMcKeg advised pt is refusing all medications and states they are not what she is taking at home.  Pt is confused.  Paged FYI to let Dr. Budd PalmerMcKeg know and he has seen her this morning but was not aware.

## 2014-05-19 NOTE — Progress Notes (Signed)
Family Medicine Teaching Service Daily Progress Note Intern Pager: [phone]  Patient name: Lacey LassoRosie W Mccarthy Medical record number: [id number] Date of birth: 09/02/1918 Age: 80 y.o. Gender: female  Primary Care Provider: Marikay AlarSonnenberg, Eric, MD Consultants: None Code Status: Limited: DNR, intubation OK  Pt Overview and Major Events to Date:  6/28: patient admitted, vanc and cefepime started  Assessment and Plan: [name] is a 80 y.o. female presenting with HCAP. PMH is significant for hypertension, dementia, glaucoma   # Healthcare associated pneumonia: on antibiotics and requiring oxygen. Back up to 2L with desats to 80s while off. - Vancomycin and cefepime per pharmacy for HCAP (6/28>>DC'd on 7/1) - levofloxacin 750mg  Q48H - follow-up blood cultures (6/28): No growth - follow-up urine culture (6/28): No growth - Oxygen via [place] to keep O2 sats >92%; wean to room air as tolerated - Tylenol for fever and mild pain PRN   # Shortness of breath: elevated proBNP. CTA negative for PE. Patient not endorsing symptoms from last encounter - oxygen as needed to keep sats >92% - consider breathing trial for home oxygen qualification.   # Hypertension: currently hypertensive.  - Continue lisinopril 20mg  daily  - Continue atenolol 50mg  daily  - Continue amlodipine 5mg  daily  - Monitor BPs   # Depression  - continue citalopram   # Glaucoma  - continue latanoprost and brimonidine-timolol   FEN/GI: Heart healthy diet, 1/2NS @100ml /hr  Prophylaxis: Heparin subq  Disposition: SNF pending transition to PO antibiotics  Subjective:  Patient awake and responsive. Showed symptoms of dementia, nurse reports this is normal behavior for her. Currently on 2L .  Objective: Temp:  [97.4 F (36.3 C)-98.6 F (37 C)] 98.4 F (36.9 C) (07/01 1004) Pulse Rate:  [54-60] 60 (07/01 1004) Resp:  [18-20] 18 (07/01 1004) BP: (126-138)/(41-90) 134/41 mmHg (07/01 1004) SpO2:  [96 %-97 %] 97 % (07/01  1004)  Physical Exam: General: Sitting up in bed. In no acute distress Cardiovascular: Regular rate and rhythm, no murmurs, 2+ distal pulses Respiratory: Bibasilar crackles, no wheezing, moderate suprasternal retractions Abdomen: Soft, non-tender, non-distended Extremities: Calfs non-tender, no edema  Laboratory:  Recent Labs Lab 05/16/14 2013 05/17/14 0400 05/18/14 1039  WBC 5.1 4.8 4.1  HGB 13.4 11.3* 12.3  HCT 39.6 33.2* 35.8*  PLT 222 179 176    Recent Labs Lab 05/16/14 2013 05/17/14 0400 05/18/14 1039  NA 139 139 139  K 4.1 3.8 3.7  CL 101 106 104  CO2 24 22 22   BUN 12 9 8   CREATININE 0.85 0.71 0.70  CALCIUM 9.4 8.5 9.0  GLUCOSE 107* 80 73   Pro B Natriuretic peptide (BNP)  Date Value Ref Range Status  05/16/2014 1534.0* 0 - 450 pg/mL Final    Imaging/Diagnostic Tests: Ct Angio Chest Pe W/cm &/or Wo Cm  05/17/2014   CLINICAL DATA:  Fever and shortness of breath. Question pulmonary embolus.  EXAM: CT ANGIOGRAPHY CHEST WITH CONTRAST  TECHNIQUE: Multidetector CT imaging of the chest was performed using the standard protocol during bolus administration of intravenous contrast. Multiplanar CT image reconstructions and MIPs were obtained to evaluate the vascular anatomy.  CONTRAST:  80 mL OMNIPAQUE IOHEXOL 350 MG/ML SOLN  COMPARISON:  PA and lateral chest 04/01/2014 and 05/16/2014.  FINDINGS: No pulmonary embolus is identified. The patient has small to moderate bilateral pleural effusions. There is no pericardial effusion. Mild cardiomegaly is noted. Atherosclerotic vascular disease is seen. No axillary, hilar or mediastinal lymphadenopathy is identified. The lungs show only some  mild compressive atelectasis. Incidentally imaged upper abdomen is unremarkable. No lytic or sclerotic bony lesion is identified.  Review of the MIP images confirms the above findings.  IMPRESSION: Negative for pulmonary embolus.  Small to moderate pleural effusions, larger on the right.  Mild  cardiomegaly without edema.  Atherosclerosis.   Electronically Signed   By: Drusilla Kannerhomas  Dalessio M.D.   On: 05/17/2014 15:37    Kathee DeltonIan D McKeag, MD 05/19/2014, 2:30 PM PGY-1, [facility] FPTS Intern pager: [phone], text pages welcome

## 2014-05-20 NOTE — Clinical Social Work Psychosocial (Addendum)
Clinical Social Work Department BRIEF PSYCHOSOCIAL ASSESSMENT 05/20/2014  Patient:  [name],[name]     Account Number:  [id number]     Admit date:  05/16/2014  Clinical Social Worker:  [name],Lacey Cahall, LCSW  Date/Time:  05/20/2014 02:16 AM  Referred by:  Physician  Date Referred:  05/19/2014 Referred for  SNF Placement   Other Referral:   Interview type:  Family Other interview type:    PSYCHOSOCIAL DATA Living Status:  FACILITY Admitted from facility:  [facility] Level of care:  Skilled Nursing Facility Primary support name:  Mccarthy,Lacey Primary support relationship to patient:  CHILD, ADULT Degree of support available:   Patient's family is very supportive and involved. Granddaughter [name] is patient's POA.    CURRENT CONCERNS Current Concerns  Post-Acute Placement   Other Concerns:    SOCIAL WORK ASSESSMENT / PLAN CSW talked with patient's granddaughter [name] regarding discharge plans. Ms. [name] confirmed that [facility] is [facility] and reported that she has been there approximately 6 months. She added that the plan is for [facility] to [facility] and all of the family members are aware. Ms. [name] reported that she will alert other family members of patient's return to facility.   Assessment/plan status:  Psychosocial Support/Ongoing Assessment of Needs Other assessment/ plan:   Information/referral to community resources:   None needed or reported at this time.    PATIENT'S/FAMILY'S RESPONSE TO PLAN OF CARE: Ms. [name] was pleasant and receptive to talking with CSW regarding d/c plans. CSW unable to discuss discharge plans with patient as she is only oriented to self at this time.

## 2014-05-20 NOTE — Discharge Summary (Signed)
.  Family Medicine Teaching Service  Discharge Note : Attending Sara Neal MD Pager [phone] Office [phone] I have seen and examined this patient, reviewed their chart and discussed discharge planning with the resident at the time of discharge. I agree with the discharge plan as above.  

## 2014-05-20 NOTE — Progress Notes (Signed)
CALL PAGER [phone] for any questions or notifications regarding this patient   FMTS Attending Daily Note: Zahniya Zellars MD  Attending pager:[phone]  office [phone]  I  have seen and examined this patient, reviewed their chart. I have discussed this patient with the resident. I agree with the resident's findings, assessment and care plan. 

## 2014-05-20 NOTE — Care Management Note (Signed)
CARE MANAGEMENT NOTE 05/20/2014  Patient:  [name],[name]   Account Number:  [id number]  Date Initiated:  05/20/2014  Documentation initiated by:  Mkenzie Dotts  Subjective/Objective Assessment:   Noted that pt is SNF resident, and may need oxygen at d/c.     Action/Plan:   Discussed with pt RN , need to qualify pt for oxygen. If pt qualified will ask Md to add to d/c summary.   Anticipated DC Date:  05/20/2014   Anticipated DC Plan:  SKILLED NURSING FACILITY         Choice offered to / List presented to:             Status of service:  Completed, signed off Medicare Important Message given?  YES (If response is "NO", the following Medicare IM given date fields will be blank) Date Medicare IM given:  05/20/2014 Medicare IM given by:   Date Additional Medicare IM given:   Additional Medicare IM given by:    Discharge Disposition:  SKILLED NURSING FACILITY  Per UR Regulation:    If discussed at Long Length of Stay Meetings, dates discussed:    Comments:

## 2014-05-20 NOTE — Discharge Instructions (Signed)
Ms. [name] was originally admitted to the hospital for possible pneumonia.  However, she did well from a breathing standpoint and we thought that her shortness of breath was related to some fluid surrounding her lungs.  She will be transferred back [facility] for further care.

## 2014-05-20 NOTE — Clinical Social Work Note (Addendum)
Patient medically stable for discharge back to [facility] today. Discharge information transmitted to facility and transportation will be provided by [facility]. CSW talked with patient granddaughter Josph Machoamela Petty by phone today and advised her of patient's readiness for discharge. Call made to Ms. Petty at 4:33 pm to inform her that ambulance transport at hospital to take patient back to [facility]. [name] expressed appreciation for CSW's call.  Genelle BalVanessa Gaylen Venning, MSW, LCSW [phone]

## 2014-05-21 ENCOUNTER — Encounter: Payer: Self-pay | Admitting: Pharmacist

## 2014-05-21 ENCOUNTER — Non-Acute Institutional Stay: Payer: Medicare Other | Admitting: Family Medicine

## 2014-05-21 ENCOUNTER — Encounter: Payer: Self-pay | Admitting: Family Medicine

## 2014-05-21 DIAGNOSIS — I2721 Secondary pulmonary arterial hypertension: Secondary | ICD-10-CM

## 2014-05-21 DIAGNOSIS — I509 Heart failure, unspecified: Secondary | ICD-10-CM

## 2014-05-21 DIAGNOSIS — I2789 Other specified pulmonary heart diseases: Secondary | ICD-10-CM

## 2014-05-21 DIAGNOSIS — I10 Essential (primary) hypertension: Secondary | ICD-10-CM

## 2014-05-21 DIAGNOSIS — I5030 Unspecified diastolic (congestive) heart failure: Secondary | ICD-10-CM | POA: Insufficient documentation

## 2014-05-21 HISTORY — DX: Secondary pulmonary arterial hypertension: I27.21

## 2014-05-21 HISTORY — DX: Unspecified diastolic (congestive) heart failure: I50.30

## 2014-05-21 NOTE — Progress Notes (Signed)
Patient ID: Lacey LassoRosie W Mccarthy, female   DOB: 07/06/1918, 80 y.o.   MRN: [id number]  [facility]  Provider: Acquanetta Bellingodd Tinika Bucknam, MD Location of Care: [facility]  Chief Complaint: Nursing Home Admission Clinical information obtained from Ms Josph Machoamela Mccarthy (granddgt) by phone, from patient, from [facility] staff, and from EMR.   HISTORY OF PRESENT ILLNESS: [name] is a 80 yr old  female.  [name] was admitted from 05/16/14 to 05/20/14 with febrile illness, decreased level of consciousness anddyspnea.  Patient initially treated with BS IV ABx until culutre negative.  CT of Chest showed bilateral small to moderate Pleural effusions, ProBNP 1534, and Echocardiogram with normal EF%, LVH, Pulmonary Artery Preszsure of 118 mmHg with dilated RV. Blood and Urine cultures negative.  No infiltrates on CT chest or CHEST XRAY.  No leukocystosis. Patient was afebrile for 72 hours before discharge  Today, pt is without complaint.  Nursing reports patient at her baseline function and behavior.   No complaint of shortness of breath, no pain,   Outpatient Encounter Prescriptions as of 05/21/2014  Medication Sig  . acetaminophen (TYLENOL) 325 MG tablet Take 650 mg by mouth every 4 (four) hours as needed for pain.  [place] Kitchen. amLODipine (NORVASC) 5 MG tablet Take 1 tablet (5 mg total) by mouth daily.  Mar[place] Kitchen. aspirin 81 MG tablet Take 81 mg by mouth daily.  Mar[place] Kitchen. atenolol (TENORMIN) 100 MG tablet Take 50 mg by mouth daily.  . bisacodyl (DULCOLAX) 5 MG EC tablet Take 10 mg by mouth daily as needed for moderate constipation.  . brimonidine-timolol (COMBIGAN) 0.2-0.5 % ophthalmic solution Place 1 drop into both eyes 2 (two) times daily. Wait 3-5 minutes between eye drops.  . citalopram (CELEXA) 20 MG tablet Take 10 mg by mouth daily.  Mar[place] Kitchen. docusate sodium (COLACE) 100 MG capsule Take 100 mg by mouth daily. as needed for constipation  . latanoprost (XALATAN) 0.005 % ophthalmic  solution Place 1 drop into both eyes at bedtime.   Marland Kitchen. lisinopril (PRINIVIL,ZESTRIL) 20 MG tablet Take 20 mg by mouth daily.  Mar[place] Kitchen. LORazepam (ATIVAN) 0.5 MG tablet Take 1 tablet (0.5 mg total) by mouth every 8 (eight) hours as needed for anxiety.  . Magnesium Hydroxide (MILK OF MAGNESIA PO) Take 30 mLs by mouth as needed (for constipation).  . Multiple Vitamins-Minerals (OCUVITE PRESERVISION PO) Take 1 tablet by mouth daily.  . simvastatin (ZOCOR) 40 MG tablet Take 40 mg by mouth every evening.   . Sodium Phosphates (RA SALINE ENEMA RE) Place rectally as needed.  . traZODone (DESYREL) 50 MG tablet Take 75 mg by mouth at bedtime.   . Vitamin D, Ergocalciferol, (DRISDOL) 50000 UNITS CAPS capsule Take 50,000 Units by mouth every 30 (thirty) days.   History Past Medical History  Diagnosis Date  . Hypertension   . Dementia     Possible Lewy body dementia  . Hyperlipidemia   . Diabetes   . CAD (coronary artery disease)   . Degenerative arthritis   . Gait disorder   . Glaucoma   . Dementia with Lewy bodies 03/27/2013  . Heart failure with preserved ejection fraction 05/21/2014    Pleural effusions bilaterally on Chest CT 05/17/14. Echocardiogram 05/17/14: LVH; EF 55-60%; Dynamic Outflow obstruction; Mild Mitral Stenosis;   High Ventricular Filling Pressure, Dilated RV, Pulmonary Artery Pressure 118 mmHg   . Pulmonary artery hypertension 05/21/2014   Family History  Problem Relation Age of Onset  . Stroke Father   .  Cancer Brother   . Heart failure Brother                                                                                         Activities of Daily Living : Dependent   Diet:  Heart healthy Supplemental shakes:  none  Filed Vitals:   05/21/14 1125 05/21/14 1129  BP: 134/41   Pulse: 60   Temp: 98.6 F (37 C)   Resp: 16   Weight: 139 lb (63.05 kg)   SpO2: 88% 95%   Wt Readings from Last 3 Encounters:  05/21/14 139 lb (63.05 kg)  05/20/14 134 lb 0.6 oz (60.8 kg)  09/23/13 123  lb 9.6 oz (56.065 kg)    Review of Systems: diminished accuracy because of moderate to severe dementia  General: Denies fevers, chills  Ears/Nose/Throat: (+) Rhinorrhea   Cardiovascular: Denies chest pains,peripheral edema.  Respiratory: Denies cough, sputum, dyspnea.  Genitourinary: Denies dysuria, Musculoskeletal: Denies joint pain   PHYSICAL EXAM:. General: No acute distress, well nourished, pleasant, sitting in WC in hall off of oxygen supplement HEENT:  Clear bilateral nares rhinorrhea  Neck: No JVD CV: distanct heart sounds, RRR, no murmur, no ankle swelling RESP: No resp distress or accessory muscle use.  Bibasilar crackles, No wheezing, no rales, no rhonchi.  ABD:  Soft, Non-tender, non-distended, +bowel sounds   Gait:  Able to ambulate alone, good balance Skin:  No significant skin lesions or rash Neurologic:  Cranial nerves 2-12 grossly intact, normal tone in extremities, normal strength in extremities Psych:  Speech clear, language initially goal directed then quickly goes off target,Memory: recalls hospitalization but unable to identify how recent it was.    Assessment and Plan:   Febrile Illness - resolved.   - Likely viral illness.   Heart failure with preserved ejection fraction - CT of Chest showed bilateral small to moderate Pleural effusions, ProBNP 1534, and Echocardiogram with normal EF%, LVH, Pulmonary Artery Pressure of 118 mmHg with dilated RV.  - Needs Oxygen currently. Uncertain if this is just an acute need or a chronic one. - Will continue Oxygen 24 hours a day for now.  Patient was at 88% on room air at rest today and went up to 95% on 2 left/min [place] at rest.  - Recheck SaO2 in couple weeks to see if patient continues to need oxygen.  - Will monitor respiratory status to see if patient needs to start a low dose diuretic (with K+ supplementation and BMET monitoring) for HFpEF.    Pulmonary artery hypertension - I do not see prior documentation of  PAP measures, let alone one of 118 mmHg.  - Uncertain if this is PAH is secondary to HFpEF and/or mitral stenosis and/or lung disease.   - There was no evidence of thromboembolic disease on Chest CT.  - Will treat with Oxygen supplementation, may use low dose loop diuretic therapy,    Essential Hypertension - Patient has a relatively low diastolic pressure.  In setting of CAD, I would like to see DBP up somewhere closer to 60 mmHg most of the time and SBP above 130 but less than 150 most  of the time.  Multiple antihypertensive drug therapies in elderly NH patients with reductions of BP less than 130 is associated with excess mortality compared to less reduction of BP. - Changed Amlodipine to 2.5 mg daily from 5 mg daily.  - Continue Lisinopril 20 mg daily and atenolol 50 mg daily.  May consider reduction in atenolol dose if needed to achieve BP goals.   Glaucoma - Stable - continue Latanoprostol and  Combigan  Polypharmacy - Will need to see when patient last received lorazepam and reason for it.   Depression - If patient truly has major depression disorder, then the adage to "start low and go slow" in elderly patients does not apply.  Lower than standard doses of antidepressants in elderly are not benifitial and only expose patient to adverse effects.     Code Status:  Code Status: Limited, DNR, intubation OK; .    First contact:  HCPOA agent: Brenda Mccarthy-Ball (h) [phone], (m) [phone]  Follow Up:  30 days routine by PCP

## 2014-05-23 LAB — CULTURE, BLOOD (ROUTINE X 2)
CULTURE: NO GROWTH
Culture: NO GROWTH

## 2014-05-25 ENCOUNTER — Non-Acute Institutional Stay (INDEPENDENT_AMBULATORY_CARE_PROVIDER_SITE_OTHER): Payer: Medicare Other | Admitting: Family Medicine

## 2014-05-25 DIAGNOSIS — I10 Essential (primary) hypertension: Secondary | ICD-10-CM

## 2014-05-25 DIAGNOSIS — I509 Heart failure, unspecified: Secondary | ICD-10-CM

## 2014-05-25 DIAGNOSIS — I5030 Unspecified diastolic (congestive) heart failure: Secondary | ICD-10-CM

## 2014-05-25 NOTE — Progress Notes (Signed)
Patient ID: Lacey LassoRosie W Mccarthy, female   DOB: 06/13/1918, 80 y.o.   MRN: [id number] Subjective:   CC: F/u of respiratory status  HPI:   Patient denies any complaints today and per nursing staff she has not had any issues today with chest pain, dyspnea, or other concerns.  Review of Systems - Per HPI.   PMH: Has been on 2L O2  Medications: reviewed    Objective:  Physical Exam 7/5: 97.40F, 69, 17, 134/79, 97% GEN: NAD, seated in wheelchair CV: RRR, II/VI systolic murmur best at LUSB, 2+ B radial pulses PULM: CTAB, normal effort, [place] out ABD: Soft, nontender, nondistended EXTR: Trace bilateral LE edema NEURO: Normal speech, awake, alert, no focal deficit SKIN: No rash or cyanosis     Assessment:     [name] is a 80 y.o. female with recent admission for febrile illness and respiratory distress being followed up in NH.    Plan:     Respiratory distress: Patient is doing well today, with no respiratory complaints and normal exam, off O2. Afebrile. - Will recheck tomorrow and check O2 sat if still off O2.  HTN: Norvasc recently decreased from 5 to 2.5mg  daily 7/3. Well-controlled today. - Continue to monitor.   [name] Lacey Keyvon Herter, MD [facility]

## 2014-05-25 NOTE — Assessment & Plan Note (Signed)
Norvasc recently decreased from 5 to 2.5mg  daily 7/3. Well-controlled today. - Continue to monitor.

## 2014-05-25 NOTE — Assessment & Plan Note (Signed)
Patient is doing well today, with no respiratory complaints and normal exam, off O2. Afebrile. - Will recheck tomorrow and check O2 sat if still off O2.

## 2014-05-26 LAB — LIPID PANEL
Cholesterol: 137 mg/dL (ref 0–200)
HDL: 66 mg/dL (ref 35–70)
LDL Cholesterol: 65 mg/dL
Triglycerides: 31 mg/dL — AB (ref 40–160)

## 2014-05-26 LAB — HEPATIC FUNCTION PANEL
ALT: 22 U/L (ref 7–35)
AST: 24 U/L (ref 13–35)
Alkaline Phosphatase: 72 U/L (ref 25–125)
BILIRUBIN DIRECT: 0.2 mg/dL (ref 0.01–0.4)
BILIRUBIN, TOTAL: 1 mg/dL

## 2014-05-28 ENCOUNTER — Other Ambulatory Visit: Payer: Self-pay | Admitting: Family Medicine

## 2014-05-28 MED ORDER — LORAZEPAM 0.5 MG PO TABS: 0.5000 mg | ORAL_TABLET | Freq: Three times a day (TID) | ORAL | Status: DC | PRN

## 2014-06-08 ENCOUNTER — Other Ambulatory Visit: Payer: Self-pay | Admitting: Family Medicine

## 2014-06-08 MED ORDER — LORAZEPAM 0.5 MG PO TABS: 0.5000 mg | ORAL_TABLET | Freq: Three times a day (TID) | ORAL | Status: DC | PRN

## 2014-06-14 ENCOUNTER — Non-Acute Institutional Stay: Payer: Medicare Other | Admitting: Family Medicine

## 2014-06-14 DIAGNOSIS — G3183 Dementia with Lewy bodies: Principal | ICD-10-CM

## 2014-06-14 DIAGNOSIS — F0280 Dementia in other diseases classified elsewhere without behavioral disturbance: Secondary | ICD-10-CM

## 2014-06-16 ENCOUNTER — Non-Acute Institutional Stay: Payer: Medicare Other | Admitting: Family Medicine

## 2014-06-16 DIAGNOSIS — I2721 Secondary pulmonary arterial hypertension: Secondary | ICD-10-CM

## 2014-06-16 DIAGNOSIS — F0280 Dementia in other diseases classified elsewhere without behavioral disturbance: Secondary | ICD-10-CM

## 2014-06-16 DIAGNOSIS — G3183 Dementia with Lewy bodies: Secondary | ICD-10-CM

## 2014-06-16 DIAGNOSIS — I2789 Other specified pulmonary heart diseases: Secondary | ICD-10-CM

## 2014-06-16 DIAGNOSIS — I10 Essential (primary) hypertension: Secondary | ICD-10-CM

## 2014-06-16 DIAGNOSIS — I2510 Atherosclerotic heart disease of native coronary artery without angina pectoris: Secondary | ICD-10-CM

## 2014-06-16 NOTE — Progress Notes (Signed)
Patient ID: Lacey LassoRosie W Mccarthy, female   DOB: 03/31/1918, 80 y.o.   MRN: [id number] Opened in error.

## 2014-06-17 NOTE — Progress Notes (Signed)
Patient ID: [name], female   DOB: 12/01/1917, 80 y.o.   MRN: [id number]  Marikay AlarEric Sonnenberg, MD Phone: [phone]  Daiva EvesRosie W Etheleen Mccarthy is a 80 y.o. female who was seen in the nursing home for a PCP visit.  The patient reports no complaints at this time. She specifically denies any chest pain, trouble breathing, and edema.  Nursing reports no issues or complaints from their perspective and notes the patient has a tendency to wander, though they note that this is unchanged from previously. Of note she has only required 6 doses of ativan over the past month to help with agitation.   ROS: Per HPI   Physical Exam Filed Vitals:   06/17/14 2005  BP: 103/69  Pulse: 55  Temp: 96.1 F (35.6 C)  Resp: 20    Gen: Well NAD Psych: oriented to person, not oriented to place or time HEENT: PERRL,  MMM Lungs: CTABL Nl WOB Heart: RRR no MRG Exts: Non edematous BL  LE, warm and well perfused.    Assessment/Plan: Please see individual problem list.

## 2014-06-17 NOTE — Assessment & Plan Note (Addendum)
Patient has done well with regards to this issue and is currently without chest pain or shortness of breath. She is currently on aspirin, beta blocker, and an ACEI. We will continue her current treatment regimen. She has come off of her previously noted diuretic an done well with regards to edema. Will continue to follow this issue.

## 2014-06-17 NOTE — Assessment & Plan Note (Signed)
Patients blood pressure appears to continue to be on the low end of normal despite the decrease in ativan. If this continues to be on the low end of normal I would consider stopping norvasc all together. Also given pulse of 55, could consider decreasing atenolol as well as there is room for her blood pressure to increase to a more desirable goal. Will discuss with geriatrics resident to continue to monitor BP and change medications accordingly.

## 2014-06-17 NOTE — Assessment & Plan Note (Addendum)
Appears to be at baseline. Has done well off of seroquel. Has required minimal ativan over the past month. Will continue to monitor. Would consider stopping ativan in the future if continues to require so infrequent dosing.

## 2014-06-22 ENCOUNTER — Encounter: Payer: Self-pay | Admitting: Family Medicine

## 2014-06-22 NOTE — Progress Notes (Signed)
Patient ID: Timothy LassoRosie W Gasparyan, female   DOB: 09/05/1918, 80 y.o.   MRN: [id number] I have seen and examined this patient. I have discussed with Dr Birdie SonsSonnenberg.  I agree with their findings and plans as documented in their progress note.  Low BP improved with reduction in Atenolol dose.  Patient with mild hypoxemia and pulmonary hypertension (118 mmHG on last echo June 2015) that is currently stable.  Nursing reports that patient removes oxygen frequently.  They do their best in putting it back on when they see she is not wearing her [place].   She can become upset sometimes when they keep placing the oxygen back on her.   She will wear the oxygen supplement at night.    Nursing informed that goal of at least 15 hours per day of oxygen supplementation is ideal.  They will attempt to get as close to this as they can without upsetting the patient.

## 2014-07-06 ENCOUNTER — Encounter: Payer: Self-pay | Admitting: Pharmacist

## 2014-07-28 ENCOUNTER — Encounter: Payer: Self-pay | Admitting: Pharmacist

## 2014-07-28 NOTE — Progress Notes (Signed)
Patient ID: [name], female   DOB: [birth year], 80 y.o.   MRN: [id number]  Med rec performed.

## 2014-08-11 ENCOUNTER — Encounter: Payer: Self-pay | Admitting: Pharmacist

## 2014-08-13 ENCOUNTER — Ambulatory Visit: Payer: Medicare Other | Admitting: Nurse Practitioner

## 2014-08-16 ENCOUNTER — Non-Acute Institutional Stay (INDEPENDENT_AMBULATORY_CARE_PROVIDER_SITE_OTHER): Payer: Medicare Other | Admitting: Family Medicine

## 2014-08-16 DIAGNOSIS — F0280 Dementia in other diseases classified elsewhere without behavioral disturbance: Secondary | ICD-10-CM

## 2014-08-16 DIAGNOSIS — G3183 Dementia with Lewy bodies: Secondary | ICD-10-CM

## 2014-08-16 DIAGNOSIS — E785 Hyperlipidemia, unspecified: Secondary | ICD-10-CM

## 2014-08-16 DIAGNOSIS — I10 Essential (primary) hypertension: Secondary | ICD-10-CM

## 2014-08-16 LAB — HEMOGLOBIN A1C: Hgb A1c MFr Bld: 5.7 % (ref 4.0–6.0)

## 2014-08-18 MED ORDER — AMLODIPINE BESYLATE 5 MG PO TABS: 2.5000 mg | ORAL_TABLET | Freq: Every day | ORAL | Status: AC

## 2014-08-18 NOTE — Assessment & Plan Note (Signed)
Patients blood pressure is at goal at this time. Has done well with decreased dosing of atenolol. Will continue to monitor this moving forward. Will continue current medication regimen.

## 2014-08-18 NOTE — Assessment & Plan Note (Addendum)
Patient continues on zocor at this time. Well controlled on last lipid panel. Will continue zocor dosing at this time.

## 2014-08-18 NOTE — Progress Notes (Signed)
Patient ID: Timothy LassoRosie W Brigante, female   DOB: 12/10/1917, 80 y.o.   MRN: [id number]  Marikay AlarEric Herberta Pickron, MD Phone: [phone]  Daiva EvesRosie W [name] is a 80 y.o. female who is seen for a nursing home visit.   The patient reports no complaints at this time. She notes no chest pain, shortness of breath, or swelling. She has no complaints with regards to her nasal canula for O2 therapy at this time.   I spoke with nursing and they report no current issues or concerns regarding this patient.  ROS: Per HPI   Physical Exam Filed Vitals:   08/18/14 1707  BP: 137/65  Pulse: 87  Temp: 96.9 F (36.1 C)  Resp: 18    Gen: Well NAD HEENT: PERRL,  MMM, [place] in place Lungs: CTABL Nl WOB Heart: RRR no MRG Exts: Non edematous BL  LE, warm and well perfused.    Assessment/Plan: Please see individual problem list.   Marikay AlarEric Chelsae Zanella, MD [facility] PGY-3

## 2014-08-18 NOTE — Assessment & Plan Note (Signed)
Is at baseline. Doing well with scheduled seroquel and requiring minimal prn seroquel with last dosing 07/27/14. Ativan was previously d/c'd. Will continue current medication regimen of scheduled and prn seroquel. Will continue to monitor this issue in the future.

## 2014-08-19 ENCOUNTER — Encounter: Payer: Self-pay | Admitting: Family Medicine

## 2014-08-19 LAB — PROTEIN, TOTAL
ALBUMIN: 3.2
Total Protein: 5.6 g/dL

## 2014-08-31 ENCOUNTER — Encounter: Payer: Self-pay | Admitting: Family Medicine

## 2014-09-02 ENCOUNTER — Encounter: Payer: Self-pay | Admitting: Pharmacist

## 2014-09-14 NOTE — Progress Notes (Signed)
Patient ID: Lacey LassoRosie W Mccarthy, female   DOB: 03/14/1918, 80 y.o.   MRN: [id number] I have reviewed Dr Purvis SheffieldSonnenberg's plans and agree with their plans documented in their regulatory visit note.

## 2014-09-29 IMAGING — CT CT ANGIO CHEST
2 of 10 series · 19 of 46 positions shown · IV contrast (Omni 300)
Comparison: PA and lateral chest 04/01/2014 and 05/16/2014.

CLINICAL DATA: Fever and shortness of breath. Question pulmonary
embolus.

EXAM:
CT ANGIOGRAPHY CHEST WITH CONTRAST
TECHNIQUE: Multidetector CT imaging of the chest was performed using the
standard protocol during bolus administration of intravenous
contrast. Multiplanar CT image reconstructions and MIPs were
obtained to evaluate the vascular anatomy.
CONTRAST:  80 mL OMNIPAQUE IOHEXOL 350 MG/ML SOLN

[Series 6: thins · axial · 0.52mm/px · z∈[-128,+106]mm · 16 of 267 slices shown]
[im 16/267  lung]
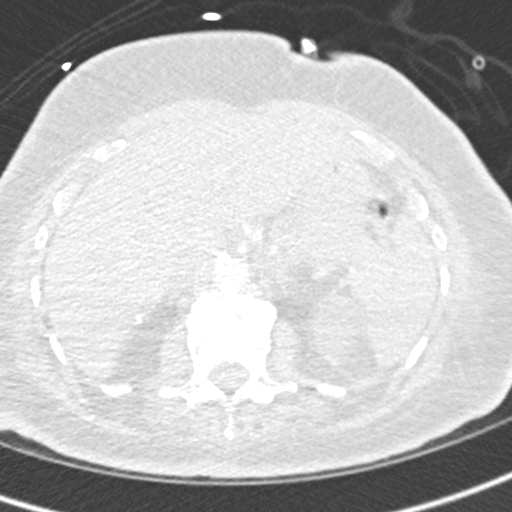
[im 32/267  soft-tissue]
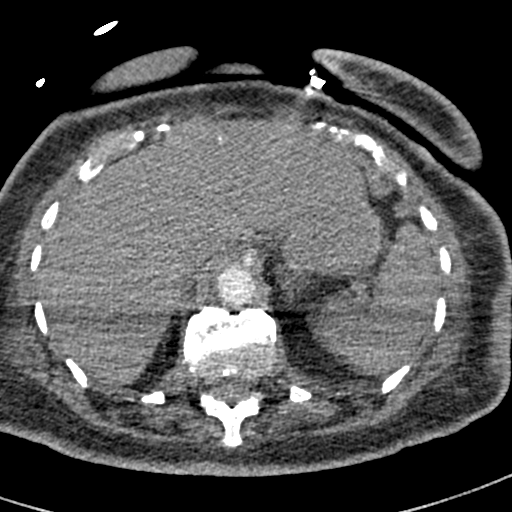
[im 47/267  lung]
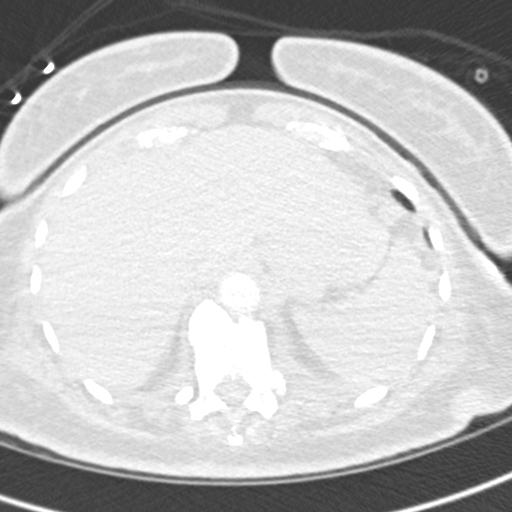
[im 63/267  soft-tissue]
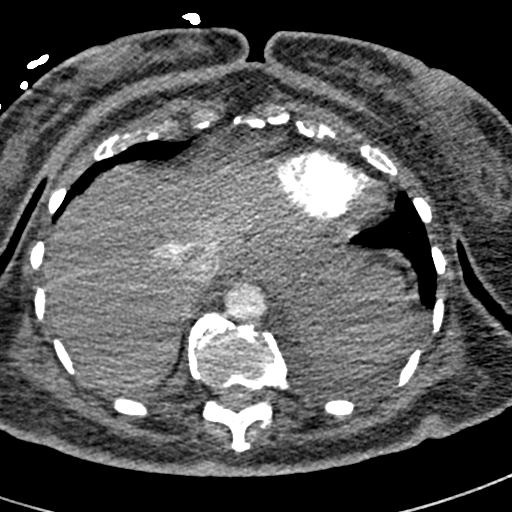
[im 79/267  lung]
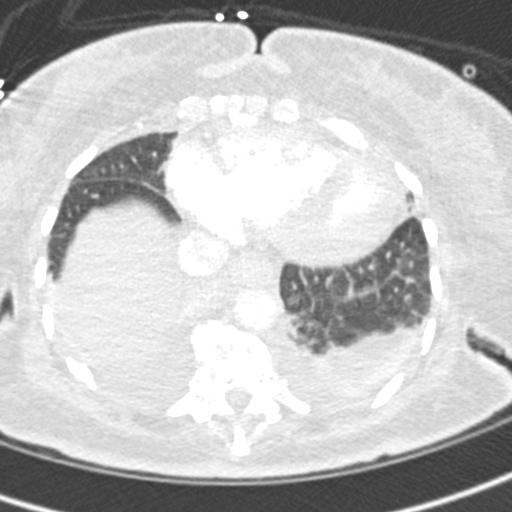
[im 94/267  soft-tissue]
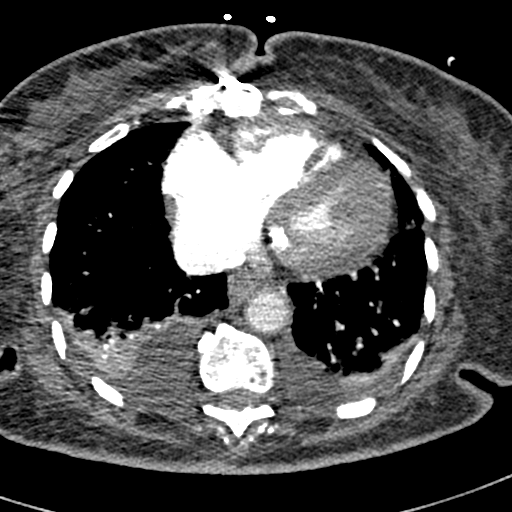
[im 110/267  lung]
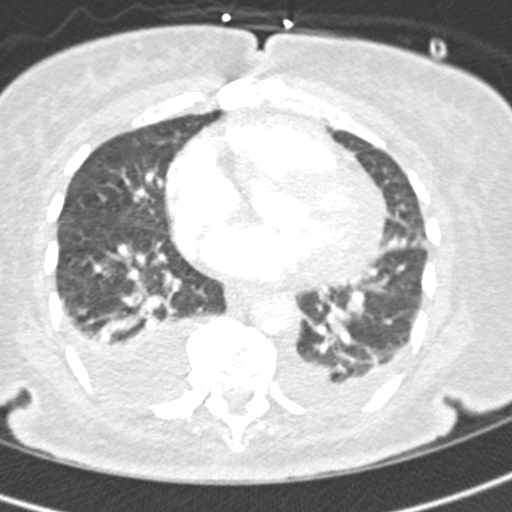
[im 126/267  soft-tissue]
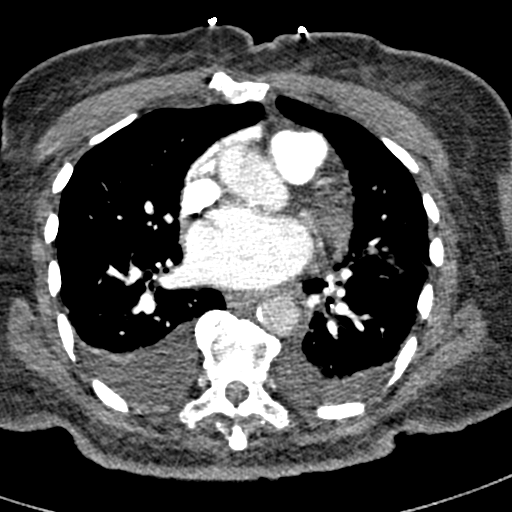
[im 141/267  lung]
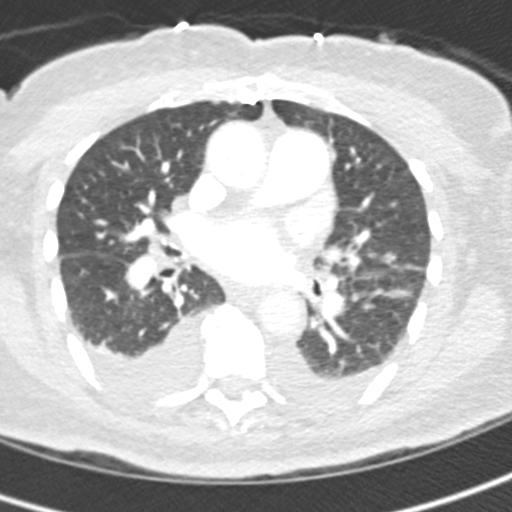
[im 157/267  soft-tissue]
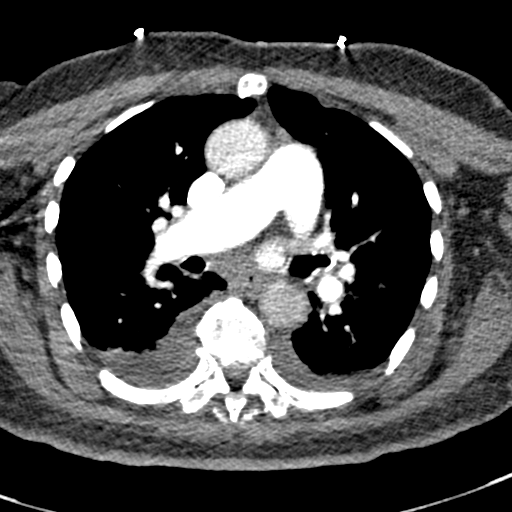
[im 173/267  lung]
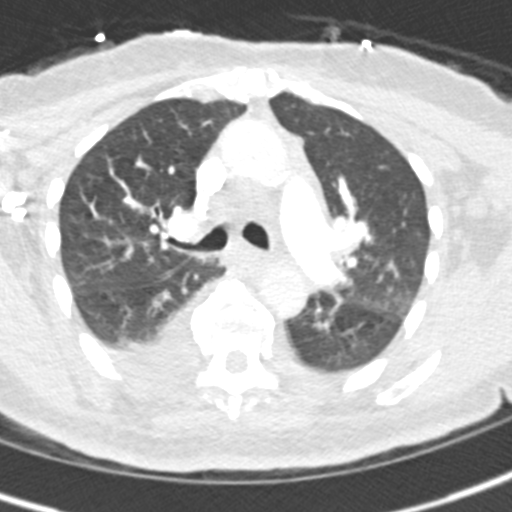
[im 188/267  soft-tissue]
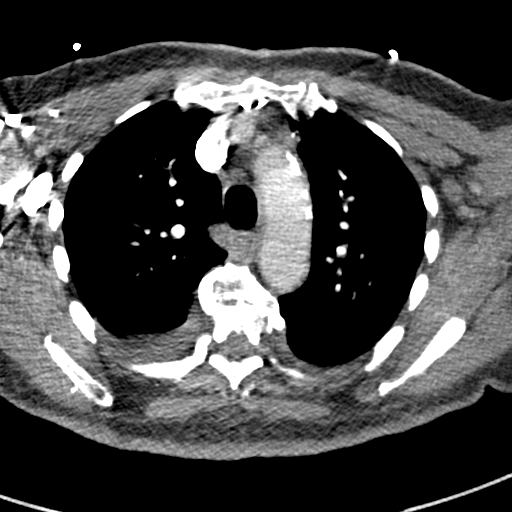
[im 204/267  lung]
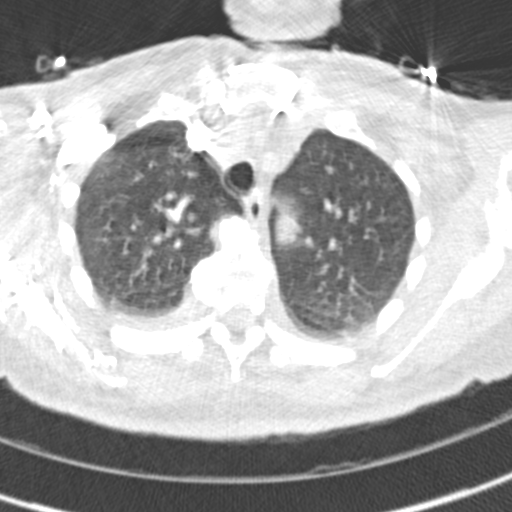
[im 220/267  soft-tissue]
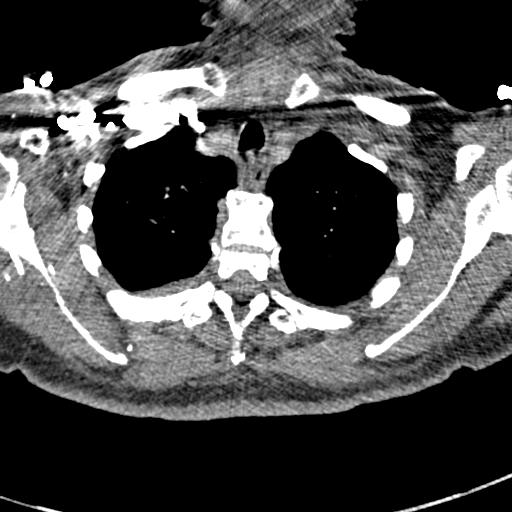
[im 235/267  lung]
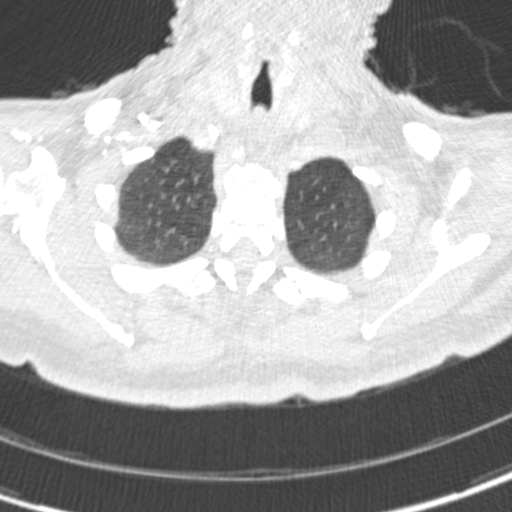
[im 251/267  soft-tissue]
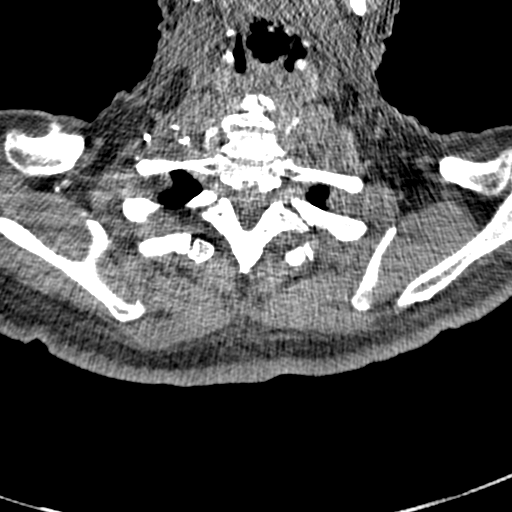

[Series 8: coronal mpr · coronal · 0.50mm/px · 3 of 109 slices shown]
[im 28/109  soft-tissue]
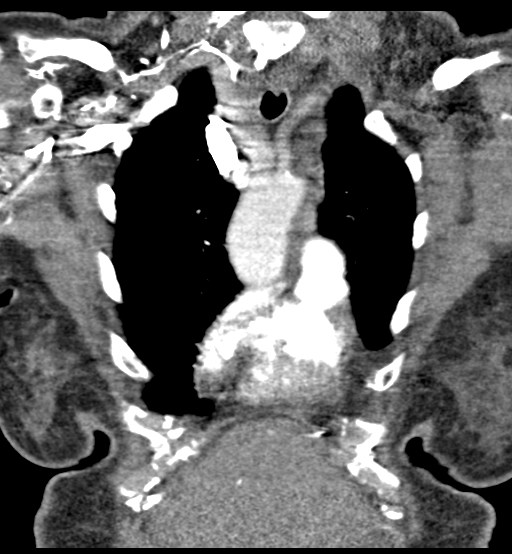
[im 55/109  soft-tissue]
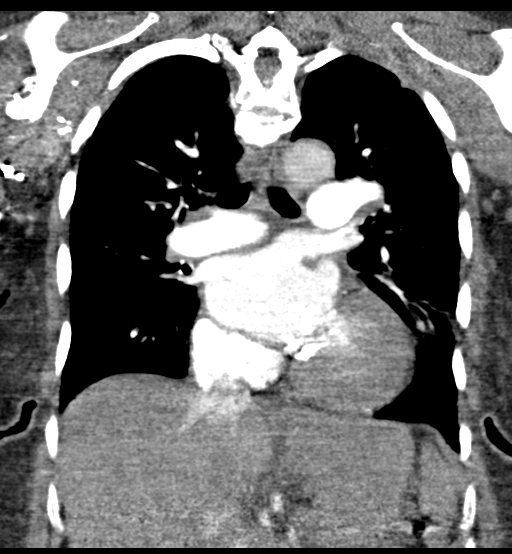
[im 82/109  soft-tissue]
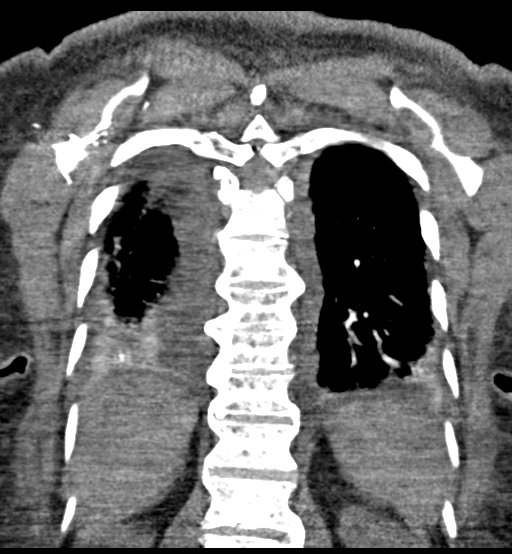

[19 of 46 positions shown; findings below may reference images not displayed]

FINDINGS: No pulmonary embolus is identified. The patient has small to
moderate bilateral pleural effusions. There is no pericardial
effusion. Mild cardiomegaly is noted. Atherosclerotic vascular
disease is seen. No axillary, hilar or mediastinal lymphadenopathy
is identified. The lungs show only some mild compressive
atelectasis. Incidentally imaged upper abdomen is unremarkable. No
lytic or sclerotic bony lesion is identified.

Review of the MIP images confirms the above findings.
IMPRESSION: Negative for pulmonary embolus.

Small to moderate pleural effusions, larger on the right.

Mild cardiomegaly without edema.

Atherosclerosis.

## 2014-10-06 ENCOUNTER — Encounter: Payer: Self-pay | Admitting: Neurology

## 2014-10-08 ENCOUNTER — Encounter: Payer: Self-pay | Admitting: Pharmacist

## 2014-10-09 ENCOUNTER — Telehealth: Payer: Self-pay | Admitting: Family Medicine

## 2014-10-09 NOTE — Telephone Encounter (Signed)
Informed by nurse that patient was taking a shower (with assistance) and conversing normally when she suddenly became unresponsive and fell to the ground. She was not breathing and had no pulse. Nurse called [phone] who confirmed that the patient was deceased when they arrived. Nurse requesting permission to release body. Permission given.

## 2014-10-19 DEATH — deceased
